# Patient Record
Sex: Female | Born: 1985 | Race: Black or African American | Hispanic: No | Marital: Single | Smoking: Never smoker
Health system: Southern US, Community
[De-identification: ages and names within clinical notes are randomized; demographics above are authoritative.]

## PROBLEM LIST (undated history)

## (undated) ENCOUNTER — Inpatient Hospital Stay: Payer: Self-pay

## (undated) DIAGNOSIS — F99 Mental disorder, not otherwise specified: Secondary | ICD-10-CM

## (undated) DIAGNOSIS — J984 Other disorders of lung: Secondary | ICD-10-CM

## (undated) DIAGNOSIS — O99019 Anemia complicating pregnancy, unspecified trimester: Secondary | ICD-10-CM

## (undated) DIAGNOSIS — F419 Anxiety disorder, unspecified: Secondary | ICD-10-CM

## (undated) DIAGNOSIS — G43909 Migraine, unspecified, not intractable, without status migrainosus: Secondary | ICD-10-CM

## (undated) DIAGNOSIS — D649 Anemia, unspecified: Secondary | ICD-10-CM

## (undated) DIAGNOSIS — D219 Benign neoplasm of connective and other soft tissue, unspecified: Secondary | ICD-10-CM

## (undated) HISTORY — DX: Other disorders of lung: J98.4

## (undated) HISTORY — PX: LAPAROSCOPY FOR ECTOPIC PREGNANCY: SUR765

## (undated) HISTORY — DX: Mental disorder, not otherwise specified: F99

## (undated) HISTORY — PX: CYST EXCISION: SHX5701

## (undated) HISTORY — DX: Migraine, unspecified, not intractable, without status migrainosus: G43.909

## (undated) HISTORY — PX: BREAST SURGERY: SHX581

## (undated) HISTORY — PX: BREAST EXCISIONAL BIOPSY: SUR124

---
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
## 2004-02-13 ENCOUNTER — Emergency Department: Payer: Self-pay | Admitting: Internal Medicine

## 2006-01-26 ENCOUNTER — Emergency Department: Payer: Self-pay | Admitting: Emergency Medicine

## 2006-07-26 ENCOUNTER — Emergency Department (HOSPITAL_COMMUNITY): Admission: EM | Admit: 2006-07-26 | Discharge: 2006-07-26 | Payer: Self-pay | Admitting: Emergency Medicine

## 2006-08-15 ENCOUNTER — Emergency Department (HOSPITAL_COMMUNITY): Admission: EM | Admit: 2006-08-15 | Discharge: 2006-08-15 | Payer: Self-pay | Admitting: *Deleted
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
## 2007-01-25 ENCOUNTER — Emergency Department: Payer: Self-pay | Admitting: Emergency Medicine

## 2007-02-08 ENCOUNTER — Emergency Department: Payer: Self-pay | Admitting: Unknown Physician Specialty

## 2007-04-20 HISTORY — PX: ECTOPIC PREGNANCY SURGERY: SHX613

## 2007-05-22 ENCOUNTER — Emergency Department: Payer: Self-pay | Admitting: Emergency Medicine
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
## 2007-11-10 ENCOUNTER — Emergency Department: Payer: Self-pay | Admitting: Emergency Medicine

## 2007-11-13 ENCOUNTER — Emergency Department: Payer: Self-pay | Admitting: Emergency Medicine

## 2007-12-05 ENCOUNTER — Emergency Department: Payer: Self-pay | Admitting: Emergency Medicine

## 2008-03-21 ENCOUNTER — Observation Stay: Payer: Self-pay | Admitting: Unknown Physician Specialty
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
## 2008-05-23 ENCOUNTER — Emergency Department: Payer: Self-pay | Admitting: Emergency Medicine
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
## 2009-05-19 ENCOUNTER — Emergency Department: Payer: Self-pay | Admitting: Unknown Physician Specialty

## 2009-06-17 ENCOUNTER — Emergency Department: Payer: Self-pay | Admitting: Internal Medicine

## 2009-06-21 ENCOUNTER — Emergency Department: Payer: Self-pay | Admitting: Emergency Medicine

## 2009-07-22 ENCOUNTER — Emergency Department: Payer: Self-pay | Admitting: Emergency Medicine
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
## 2010-04-24 ENCOUNTER — Emergency Department: Payer: Self-pay | Admitting: Unknown Physician Specialty

## 2010-08-12 ENCOUNTER — Observation Stay: Payer: Self-pay

## 2010-10-02 ENCOUNTER — Observation Stay: Payer: Self-pay

## 2010-12-12 ENCOUNTER — Observation Stay: Payer: Self-pay

## 2010-12-13 ENCOUNTER — Inpatient Hospital Stay: Payer: Self-pay

## 2011-03-29 ENCOUNTER — Emergency Department: Payer: Self-pay | Admitting: *Deleted

## 2011-09-16 ENCOUNTER — Emergency Department: Payer: Self-pay | Admitting: *Deleted
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
## 2011-10-11 ENCOUNTER — Emergency Department: Payer: Self-pay | Admitting: Emergency Medicine

## 2011-11-03 ENCOUNTER — Emergency Department: Payer: Self-pay | Admitting: *Deleted

## 2011-12-24 ENCOUNTER — Emergency Department: Payer: Self-pay | Admitting: Emergency Medicine

## 2012-03-07 ENCOUNTER — Emergency Department: Payer: Self-pay | Admitting: Emergency Medicine

## 2012-07-28 ENCOUNTER — Emergency Department: Payer: Self-pay | Admitting: Emergency Medicine

## 2012-08-26 ENCOUNTER — Emergency Department: Payer: Self-pay | Admitting: Emergency Medicine

## 2012-10-22 ENCOUNTER — Emergency Department: Payer: Self-pay | Admitting: Emergency Medicine

## 2013-04-05 LAB — HM HIV SCREENING LAB: HM HIV Screening: NEGATIVE

## 2013-08-02 ENCOUNTER — Emergency Department: Payer: Self-pay | Admitting: Emergency Medicine

## 2013-08-02 LAB — URINALYSIS, COMPLETE
Bilirubin,UR: NEGATIVE
GLUCOSE, UR: NEGATIVE mg/dL (ref 0–75)
Ketone: NEGATIVE
LEUKOCYTE ESTERASE: NEGATIVE
Nitrite: NEGATIVE
PH: 7 (ref 4.5–8.0)
PROTEIN: NEGATIVE
RBC, UR: NONE SEEN /HPF (ref 0–5)
SPECIFIC GRAVITY: 1.009 (ref 1.003–1.030)
Squamous Epithelial: 1
WBC UR: NONE SEEN /HPF (ref 0–5)

## 2013-08-02 LAB — COMPREHENSIVE METABOLIC PANEL
ALT: 21 U/L (ref 12–78)
Albumin: 4 g/dL (ref 3.4–5.0)
Alkaline Phosphatase: 61 U/L
Anion Gap: 5 — ABNORMAL LOW (ref 7–16)
BUN: 9 mg/dL (ref 7–18)
Bilirubin,Total: 0.7 mg/dL (ref 0.2–1.0)
CALCIUM: 9.1 mg/dL (ref 8.5–10.1)
CO2: 25 mmol/L (ref 21–32)
Chloride: 108 mmol/L — ABNORMAL HIGH (ref 98–107)
Creatinine: 0.86 mg/dL (ref 0.60–1.30)
GLUCOSE: 135 mg/dL — AB (ref 65–99)
Osmolality: 276 (ref 275–301)
Potassium: 3.3 mmol/L — ABNORMAL LOW (ref 3.5–5.1)
SGOT(AST): 26 U/L (ref 15–37)
SODIUM: 138 mmol/L (ref 136–145)
TOTAL PROTEIN: 8 g/dL (ref 6.4–8.2)

## 2013-08-02 LAB — CBC WITH DIFFERENTIAL/PLATELET
BASOS PCT: 0.3 %
Basophil #: 0 10*3/uL (ref 0.0–0.1)
EOS ABS: 0.1 10*3/uL (ref 0.0–0.7)
Eosinophil %: 1 %
HCT: 33.1 % — AB (ref 35.0–47.0)
HGB: 10.4 g/dL — ABNORMAL LOW (ref 12.0–16.0)
LYMPHS ABS: 1.9 10*3/uL (ref 1.0–3.6)
LYMPHS PCT: 24.6 %
MCH: 23.6 pg — ABNORMAL LOW (ref 26.0–34.0)
MCHC: 31.4 g/dL — AB (ref 32.0–36.0)
MCV: 75 fL — AB (ref 80–100)
Monocyte #: 0.3 x10 3/mm (ref 0.2–0.9)
Monocyte %: 4.3 %
Neutrophil #: 5.3 10*3/uL (ref 1.4–6.5)
Neutrophil %: 69.8 %
Platelet: 251 10*3/uL (ref 150–440)
RBC: 4.41 10*6/uL (ref 3.80–5.20)
RDW: 17.5 % — ABNORMAL HIGH (ref 11.5–14.5)
WBC: 7.6 10*3/uL (ref 3.6–11.0)

## 2013-08-02 LAB — PREGNANCY, URINE: Pregnancy Test, Urine: NEGATIVE m[IU]/mL
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
## 2013-12-25 ENCOUNTER — Emergency Department: Payer: Self-pay | Admitting: Emergency Medicine

## 2014-03-21 ENCOUNTER — Emergency Department: Payer: Self-pay | Admitting: Emergency Medicine

## 2014-03-21 LAB — URINALYSIS, COMPLETE
BACTERIA: NONE SEEN
BLOOD: NEGATIVE
Bilirubin,UR: NEGATIVE
GLUCOSE, UR: NEGATIVE mg/dL (ref 0–75)
NITRITE: NEGATIVE
PH: 5 (ref 4.5–8.0)
Protein: NEGATIVE
Specific Gravity: 1.026 (ref 1.003–1.030)
Squamous Epithelial: 4
WBC UR: 1 /HPF (ref 0–5)

## 2014-03-27 ENCOUNTER — Emergency Department: Payer: Self-pay | Admitting: Emergency Medicine

## 2014-08-24 ENCOUNTER — Emergency Department
Admission: EM | Admit: 2014-08-24 | Discharge: 2014-08-24 | Disposition: A | Discharge status: Home / Self Care | Payer: Medicaid Other | Attending: Internal Medicine | Admitting: Internal Medicine

## 2014-08-24 ENCOUNTER — Encounter: Payer: Self-pay | Admitting: Emergency Medicine

## 2014-08-24 DIAGNOSIS — Z720 Tobacco use: Secondary | ICD-10-CM | POA: Diagnosis not present

## 2014-08-24 DIAGNOSIS — L409 Psoriasis, unspecified: Secondary | ICD-10-CM | POA: Diagnosis not present

## 2014-08-24 DIAGNOSIS — R21 Rash and other nonspecific skin eruption: Secondary | ICD-10-CM | POA: Diagnosis present

## 2014-08-24 MED ORDER — PREDNISONE 20 MG PO TABS
ORAL_TABLET | ORAL | Status: AC
  Administered 2014-08-24: 60 mg via ORAL
  Filled 2014-08-24: qty 3

## 2014-08-24 MED ORDER — PREDNISONE 20 MG PO TABS
60.0000 mg | ORAL_TABLET | Freq: Once | ORAL | Status: AC
  Administered 2014-08-24: 60 mg via ORAL

## 2014-08-24 MED ORDER — RA VITAMIN A & D EX OINT
1.0000 | TOPICAL_OINTMENT | Freq: Three times a day (TID) | CUTANEOUS | Status: DC
Start: 2014-08-24 — End: 2017-10-04

## 2014-08-24 MED ORDER — PREDNISONE 10 MG (21) PO TBPK: ORAL_TABLET | ORAL | Status: DC

## 2014-08-24 NOTE — ED Provider Notes (Signed)
[facility] Emergency Department Provider Note   ____________________________________________  Time seen: 1145  I have reviewed the triage vital signs and the nursing notes.   HISTORY  Chief Complaint Rash    HPI Heidi Mata is a 29 y.o. female who presents with worsening of her chronic psoriasis. She states she awakened this morning and had new areas on her face, neck, and elbows. She's had no relief with over-the-counter creams.  History reviewed. No pertinent past medical history.  There are no active problems to display for this patient.   Past Surgical History  Procedure Laterality Date  . Cyst excision    . Ectopic pregnancy surgery  2009    Current Outpatient Rx  Name  Route  Sig  Dispense  Refill  . predniSONE (STERAPRED UNI-PAK 21 TAB) 10 MG (21) TBPK tablet      Take 6 tablets on day 1 Take 5 tablets on day 2 Take 4 tablets on day 3 Take 3 tablets on day 4 Take 2 tablets on day 5 Take 1 tablet on day 6   21 tablet   0   . Vitamins A & D (RA VITAMIN A & D) OINT   Apply externally   Apply 1 application topically 3 (three) times daily.   454 g   1     Allergies Sulfa antibiotics  History reviewed. No pertinent family history.  Social History History  Substance Use Topics  . Smoking status: Current Every Day Smoker -- 0.20 packs/day    Types: Cigarettes  . Smokeless tobacco: Not on file  . Alcohol Use: 0.6 oz/week    1 Standard drinks or equivalent per week    Review of Systems  Constitutional: Negative for fever. Eyes: Negative for visual changes. ENT: Negative for sore throat. Cardiovascular: Negative for chest pain. Respiratory: Negative for shortness of breath. Gastrointestinal: Negative for abdominal pain, vomiting and diarrhea. Musculoskeletal: Negative for pain. Skin: Positive for rash. Neurological: Negative for headaches, focal weakness or numbness.  10-point ROS otherwise  negative.  ____________________________________________   PHYSICAL EXAM:  VITAL SIGNS: ED Triage Vitals  Enc Vitals Group     BP 08/24/14 1118 131/75 mmHg     Pulse Rate 08/24/14 1118 85     Resp 08/24/14 1118 16     Temp 08/24/14 1118 98.8 F (37.1 C)     Temp Source 08/24/14 1118 Oral     SpO2 08/24/14 1118 96 %     Weight 08/24/14 1118 130 lb (58.968 kg)     Height 08/24/14 1118 5\' 2"  (1.575 m)     Head Cir --      Peak Flow --      Pain Score 08/24/14 1120 8     Pain Loc --      Pain Edu? --      Excl. in [place]? --      Constitutional: Alert and oriented. Well appearing and in no distress. Eyes: Conjunctivae are normal. PERRL. Normal extraocular movements. Denies vision change. ENT   Head: Normocephalic and atraumatic.   Nose: No congestion/rhinnorhea.   Mouth/Throat: Mucous membranes are moist.   Neck: No stridor. Respiratory: Normal respiratory effort without tachypnea nor retractions. Musculoskeletal: Nontender with normal range of motion in all extremities. No joint effusions.  No lower extremity tenderness nor edema. Neurologic:  Normal speech and language. No gross focal neurologic deficits are appreciated. Speech is normal. No gait instability. Skin:  Scaly patch posterior neck, perioral area, and inner  canthus of the right eye. Psychiatric: Mood and affect are normal. Speech and behavior are normal. Patient exhibits appropriate insight and judgment.  ____________________________________________    LABS (pertinent positives/negatives)    ____________________________________________   EKG    ____________________________________________    RADIOLOGY    ____________________________________________   PROCEDURES  Procedure(s) performed: None  Critical Care performed: No  ____________________________________________   INITIAL IMPRESSION / ASSESSMENT AND PLAN / ED COURSE  Pertinent labs & imaging results that were available  during my care of the patient were reviewed by me and considered in my medical decision making (see chart for details).  Patient strongly encouraged to follow up with a dermatologist. Advised to return to the emergency department for symptoms that change or worsen if she is unable to schedule appointment with dermatology or primary care.  ____________________________________________   FINAL CLINICAL IMPRESSION(S) / ED DIAGNOSES  Final diagnoses:  Psoriasis (a type of skin inflammation)     Victorino Dike, FNP 08/24/14 [place], DO 08/27/14 2250

## 2014-08-24 NOTE — Discharge Instructions (Signed)
Psoriasis Psoriasis is a common, long-lasting (chronic) inflammation of the skin. It affects both men and women equally, of all ages and all races. Psoriasis cannot be passed from person to person (not contagious). Psoriasis varies from mild to very severe. When severe, it can greatly affect your quality of life. Psoriasis is an inflammatory disorder affecting the skin as well as other organs including the joints (causing an arthritis). With psoriasis, the skin sheds its top layer of cells more rapidly than it does in someone without psoriasis. CAUSES  The cause of psoriasis is largely unknown. Genetics, your immune system, and the environment seem to play a role in causing psoriasis. Factors that can make psoriasis worse include:  Damage or trauma to the skin, such as cuts, scrapes, and sunburn. This damage often causes new areas of psoriasis (lesions).  Winter dryness and lack of sunlight.  Medicines such as lithium, beta-blockers, antimalarial drugs, ACE inhibitors, nonsteroidal anti-inflammatory drugs (ibuprofen, aspirin), and terbinafine. Let your caregiver know if you are taking any of these drugs.  Alcohol. Excessive alcohol use should be avoided if you have psoriasis. Drinking large amounts of alcohol can affect:  How well your psoriasis treatment works.  How safe your psoriasis treatment is.  Smoking. If you smoke, ask your caregiver for help to quit.  Stress.  Bacterial or viral infections.  Arthritis. Arthritis associated with psoriasis (psoriatic arthritis) affects less than 10% of patients with psoriasis. The arthritic intensity does not always match the skin psoriasis intensity. It is important to let your caregiver know if your joints hurt or if they are stiff. SYMPTOMS  The most common form of psoriasis begins with little red bumps that gradually become larger. The bumps begin to form scales that flake off easily. The lower layers of scales stick together. When these scales  are scratched or removed, the underlying skin is tender and bleeds easily. These areas then grow in size and may become large. Psoriasis often creates a rash that looks the same on both sides of the body (symmetrical). It often affects the elbows, knees, groin, genitals, arms, legs, scalp, and nails. Affected nails often have pitting, loosen, thicken, crumble, and are difficult to treat.  "Inverse psoriasis"occurs in the armpits, under breasts, in skin folds, and around the groin, buttocks, and genitals.  "Guttate psoriasis" generally occurs in children and young adults following a recent sore throat (strep throat). It begins with many small, red, scaly spots on the skin. It clears spontaneously in weeks or a few months without treatment. DIAGNOSIS  Psoriasis is diagnosed by physical exam. A tissue sample (biopsy) may also be taken. TREATMENT The treatment of psoriasis depends on your age, health, and living conditions.  Steroid (cortisone) creams, lotions, and ointments may be used. These treatments are associated with thinning of the skin, blood vessels that get larger (dilated), loss of skin pigmentation, and easy bruising. It is important to use these steroids as directed by your caregiver. Only treat the affected areas and not the normal, unaffected skin. People on long-term steroid treatment should wear a medical alert bracelet. Injections may be used in areas that are difficult to treat.  Scalp treatments are available as shampoos, solutions, sprays, foams, and oils. Avoid scratching the scalp and picking at the scales.  Anthralin medicine works well on areas that are difficult to treat. However, it stains clothes and skin and may cause temporary irritation.  Synthetic vitamin D (calcipotriene)can be used on small areas. It is available by prescription. The forms  of synthetic vitamin D available in health food stores do not help with psoriasis.  Coal tarsare available in various strengths  for psoriasis that is difficult to treat. They are one of the longest used treatments for difficult to treat psoriasis. However, they are messy to use.  Light therapy (UV therapy) can be carefully and professionally monitored in a dermatologist's office. Careful sunbathing is helpful for many people as directed by your caregiver. The exposure should be just long enough to cause a mild redness (erythema) of your skin. Avoid sunburn as this may make the condition worse. Sunscreen (SPF of 30 or higher) should be used to protect against sunburn. Cataracts, wrinkles, and skin aging are some of the harmful side effects of light therapy.  If creams (topical medicines) fail, there are several other options for systemic or oral medicines your caregiver can suggest. Psoriasis can sometimes be very difficult to treat. It can come and go. It is necessary to follow up with your caregiver regularly if your psoriasis is difficult to treat. Usually, with persistence you can get a good amount of relief. Maintaining consistent care is important. Do not change caregivers just because you do not see immediate results. It may take several trials to find the right combination of treatment for you. PREVENTING FLARE-UPS  Wear gloves while you wash dishes, while cleaning, and when you are outside in the cold.  If you have radiators, place a bowl of water or damp towel on the radiator. This will help put water back in the air. You can also use a humidifier to keep the air moist. Try to keep the humidity at about 60% in your home.  Apply moisturizer while your skin is still damp from bathing or showering. This traps water in the skin.  Avoid long, hot baths or showers. Keep soap use to a minimum. Soaps dry out the skin and wash away the protective oils. Use a fragrance free, dye free soap.  Drink enough water and fluids to keep your urine clear or pale yellow. Not drinking enough water depletes your skin's water  supply.  Turn off the heat at night and keep it low during the day. Cool air is less drying. SEEK MEDICAL CARE IF:  You have increasing pain in the affected areas.  You have uncontrolled bleeding in the affected areas.  You have increasing redness or warmth in the affected areas.  You start to have pain or stiffness in your joints.  You start feeling depressed about your condition.  You have a fever. Document Released: 04/02/2000 Document Revised: 06/28/2011 Document Reviewed: 09/28/2010 [facility] Patient Information 2015 [place], [place]. This information is not intended to replace advice given to you by your health care provider. Make sure you discuss any questions you have with your health care provider.  Return to the ER for symptoms that change or worsen if you are unable to schedule an appointment with the PCP.

## 2014-08-24 NOTE — ED Notes (Signed)
Patient presents to the ED with rash to neck, face, and fingers.  Patient states rash broke about about 2 days ago.  Patient states she has had similar previous rash and was told it was eczema.  Patient reports blisters are very painful.

## 2014-08-29 ENCOUNTER — Emergency Department
Admission: EM | Admit: 2014-08-29 | Discharge: 2014-08-29 | Disposition: A | Discharge status: Home / Self Care | Payer: Medicaid Other

## 2014-08-30 ENCOUNTER — Encounter: Payer: Self-pay | Admitting: Emergency Medicine

## 2014-08-30 ENCOUNTER — Emergency Department
Admission: EM | Admit: 2014-08-30 | Discharge: 2014-08-30 | Disposition: A | Discharge status: Home / Self Care | Payer: Medicaid Other | Attending: Emergency Medicine | Admitting: Emergency Medicine

## 2014-08-30 DIAGNOSIS — G43101 Migraine with aura, not intractable, with status migrainosus: Secondary | ICD-10-CM

## 2014-08-30 DIAGNOSIS — Z7952 Long term (current) use of systemic steroids: Secondary | ICD-10-CM | POA: Insufficient documentation

## 2014-08-30 DIAGNOSIS — R51 Headache: Secondary | ICD-10-CM | POA: Diagnosis present

## 2014-08-30 DIAGNOSIS — Z3202 Encounter for pregnancy test, result negative: Secondary | ICD-10-CM | POA: Diagnosis not present

## 2014-08-30 LAB — URINALYSIS COMPLETE WITH MICROSCOPIC (ARMC ONLY)
Bacteria, UA: NONE SEEN
Bilirubin Urine: NEGATIVE
GLUCOSE, UA: NEGATIVE mg/dL
Hgb urine dipstick: NEGATIVE
KETONES UR: NEGATIVE mg/dL
Leukocytes, UA: NEGATIVE
Nitrite: NEGATIVE
PH: 6 (ref 5.0–8.0)
Protein, ur: NEGATIVE mg/dL
SPECIFIC GRAVITY, URINE: 1.02 (ref 1.005–1.030)

## 2014-08-30 LAB — POCT PREGNANCY, URINE: PREG TEST UR: NEGATIVE

## 2014-08-30 MED ORDER — KETOROLAC TROMETHAMINE 30 MG/ML IJ SOLN
INTRAMUSCULAR | Status: AC
  Administered 2014-08-30: 30 mg via INTRAVENOUS
  Filled 2014-08-30: qty 1

## 2014-08-30 MED ORDER — METOCLOPRAMIDE HCL 5 MG/ML IJ SOLN
INTRAMUSCULAR | Status: AC
  Administered 2014-08-30: 10 mg via INTRAVENOUS
  Filled 2014-08-30: qty 2

## 2014-08-30 MED ORDER — METOCLOPRAMIDE HCL 5 MG/ML IJ SOLN
10.0000 mg | Freq: Once | INTRAMUSCULAR | Status: AC
  Administered 2014-08-30: 10 mg via INTRAVENOUS

## 2014-08-30 MED ORDER — SODIUM CHLORIDE 0.9 % IV BOLUS (SEPSIS)
1000.0000 mL | Freq: Once | INTRAVENOUS | Status: AC
  Administered 2014-08-30: 1000 mL via INTRAVENOUS

## 2014-08-30 MED ORDER — KETOROLAC TROMETHAMINE 30 MG/ML IJ SOLN
30.0000 mg | Freq: Once | INTRAMUSCULAR | Status: AC
  Administered 2014-08-30: 30 mg via INTRAVENOUS

## 2014-08-30 NOTE — Discharge Instructions (Signed)
Migraine Headache  Please set up an appointment to follow up with neurology as soon as possible regarding her headaches. I believe that with appropriate outpatient care, you can reduce the number of headaches that you have.  Return to the ER right away should you develop a fever, stiff neck, began vomiting again, have any confusion, weakness, drippy face, trouble speaking or any other new concerns or symptoms arise.  A migraine headache is an intense, throbbing pain on one or both sides of your head. A migraine can last for 30 minutes to several hours. CAUSES  The exact cause of a migraine headache is not always known. However, a migraine may be caused when nerves in the brain become irritated and release chemicals that cause inflammation. This causes pain. Certain things may also trigger migraines, such as:  Alcohol.  Smoking.  Stress.  Menstruation.  Aged cheeses.  Foods or drinks that contain nitrates, glutamate, aspartame, or tyramine.  Lack of sleep.  Chocolate.  Caffeine.  Hunger.  Physical exertion.  Fatigue.  Medicines used to treat chest pain (nitroglycerine), birth control pills, estrogen, and some blood pressure medicines. SIGNS AND SYMPTOMS  Pain on one or both sides of your head.  Pulsating or throbbing pain.  Severe pain that prevents daily activities.  Pain that is aggravated by any physical activity.  Nausea, vomiting, or both.  Dizziness.  Pain with exposure to bright lights, loud noises, or activity.  General sensitivity to bright lights, loud noises, or smells. Before you get a migraine, you may get warning signs that a migraine is coming (aura). An aura may include:  Seeing flashing lights.  Seeing bright spots, halos, or zigzag lines.  Having tunnel vision or blurred vision.  Having feelings of numbness or tingling.  Having trouble talking.  Having muscle weakness. DIAGNOSIS  A migraine headache is often diagnosed based  on:  Symptoms.  Physical exam.  A CT scan or MRI of your head. These imaging tests cannot diagnose migraines, but they can help rule out other causes of headaches. TREATMENT Medicines may be given for pain and nausea. Medicines can also be given to help prevent recurrent migraines.  HOME CARE INSTRUCTIONS  Only take over-the-counter or prescription medicines for pain or discomfort as directed by your health care provider. The use of long-term narcotics is not recommended.  Lie down in a dark, quiet room when you have a migraine.  Keep a journal to find out what may trigger your migraine headaches. For example, write down:  What you eat and drink.  How much sleep you get.  Any change to your diet or medicines.  Limit alcohol consumption.  Quit smoking if you smoke.  Get 7-9 hours of sleep, or as recommended by your health care provider.  Limit stress.  Keep lights dim if bright lights bother you and make your migraines worse. SEEK IMMEDIATE MEDICAL CARE IF:   Your migraine becomes severe.  You have a fever.  You have a stiff neck.  You have vision loss.  You have muscular weakness or loss of muscle control.  You start losing your balance or have trouble walking.  You feel faint or pass out.  You have severe symptoms that are different from your first symptoms. MAKE SURE YOU:   Understand these instructions.  Will watch your condition.  Will get help right away if you are not doing well or get worse. Document Released: 04/05/2005 Document Revised: 08/20/2013 Document Reviewed: 12/11/2012 [facility] Patient Information 2015 [place], [place].  This information is not intended to replace advice given to you by your health care provider. Make sure you discuss any questions you have with your health care provider. ° °

## 2014-08-30 NOTE — ED Notes (Signed)
Patient c/o vomiting since yesterday afternoon. Says it started after eating food at a work party. Also c/o sever headache.

## 2014-08-30 NOTE — ED Notes (Signed)
Pt from home. Per pt she has a history of migraine headaches. Yesterday she went to a work party and around 2pm she began experiencing a severe headache along with nausea and fatigue. She also has sensitivity to noise and light.

## 2014-08-30 NOTE — ED Notes (Signed)
poc pregnancy negative

## 2014-08-30 NOTE — ED Provider Notes (Signed)
[facility] Emergency Department Provider Note  ____________________________________________  Time seen: Approximately 11:48 AM  I have reviewed the triage vital signs and the nursing notes.   HISTORY  Chief Complaint Emesis and Headache    HPI Heidi Mata is a 29 y.o. female who presents with concerns of a "migraine" and "food poisoning".  Patient was at a work party yesterday at 2 PM, she noticed suddenly that she was seeing spots in her field of vision which she has seen previously with migraines. She states she did not have any medication to take at the time and then about 20 minutes thereafter developed a severe bifrontal pounding headache. She states she has had this same headache many times, she has been diagnosed and had CT or MRI of her brain before. She was on a regular injections for migraine prevention, but due to insurance reasons had to discontinue this.  She also reports that she started vomiting several times after the headache began which made her think she could've had food poisoning. She said her headache has been persistent. She's not had any fevers. She denies pregnancy, her last period was one week ago.  At the present time she states she has pounding headache, it is bifrontal. It is severe. It is not the worst of her life, it was rather abrupt in onset but is similar to her previous. No neck stiffness. No further vomiting today, no nausea, denies abdominal pain. No chest pain. No trouble breathing.   History reviewed. No pertinent past medical history.  There are no active problems to display for this patient.   Past Surgical History  Procedure Laterality Date  . Cyst excision    . Ectopic pregnancy surgery  2009  . Breast surgery      cyst removal, right  . Laparoscopy for ectopic pregnancy      Current Outpatient Rx  Name  Route  Sig  Dispense  Refill  . PRESCRIPTION MEDICATION      as needed (for migraines).          . predniSONE (STERAPRED UNI-PAK 21 TAB) 10 MG (21) TBPK tablet      Take 6 tablets on day 1 Take 5 tablets on day 2 Take 4 tablets on day 3 Take 3 tablets on day 4 Take 2 tablets on day 5 Take 1 tablet on day 6 Patient not taking: Reported on 08/30/2014   21 tablet   0   . Vitamins A & D (RA VITAMIN A & D) OINT   Apply externally   Apply 1 application topically 3 (three) times daily. Patient not taking: Reported on 08/30/2014   454 g   1     Allergies Lactose intolerance (gi) and Sulfa antibiotics  No family history on file.  Social History History  Substance Use Topics  . Smoking status: Never Smoker   . Smokeless tobacco: Not on file  . Alcohol Use: No    Review of Systems Constitutional: No fever/chills Eyes: No visual changes now, however did have brief episode of seeing spots or funny vision prior to headache starting. ENT: No sore throat. Cardiovascular: Denies chest pain. Respiratory: Denies shortness of breath. Gastrointestinal: No abdominal pain.  No nausea, no vomiting.  No diarrhea.  No constipation. Genitourinary: Negative for dysuria. Musculoskeletal: Negative for back pain. Skin: Negative for rash. Neurological: Negative for headaches, focal weakness or numbness.  10-point ROS otherwise negative.  ____________________________________________   PHYSICAL EXAM:  VITAL SIGNS: ED Triage Vitals  Enc Vitals Group     BP 08/30/14 1038 118/74 mmHg     Pulse Rate 08/30/14 1038 88     Resp 08/30/14 1038 16     Temp 08/30/14 1038 97.7 F (36.5 C)     Temp Source 08/30/14 1038 Oral     SpO2 08/30/14 1038 100 %     Weight 08/30/14 1038 130 lb (58.968 kg)     Height 08/30/14 1038 5\' 2"  (1.575 m)     Head Cir --      Peak Flow --      Pain Score 08/30/14 1040 8     Pain Loc --      Pain Edu? --      Excl. in [place]? --     Constitutional: Alert and oriented. Well appearing and in no acute distress. Eyes: Conjunctivae are normal. PERRL. EOMI. Head:  Atraumatic. Nose: No congestion/rhinnorhea. Mouth/Throat: Mucous membranes are moist.  Oropharynx non-erythematous. Neck: No stridor.   Cardiovascular: Normal rate, regular rhythm. Grossly normal heart sounds.  Good peripheral circulation. Respiratory: Normal respiratory effort.  No retractions. Lungs CTAB. Gastrointestinal: Soft and nontender. No distention. No abdominal bruits. No CVA tenderness. Musculoskeletal: No lower extremity tenderness nor edema.  No joint effusions. Neurologic:  Normal speech and language. No gross focal neurologic deficits are appreciated. Speech is normal. Total finger-nose-finger. Extraocular movements are intact. No visual changes. Normal fields. Cranial nerve exam is normal. Patient exhibits good strength and sensation in all extremities. The patient does have moderate photophobia. Skin:  Skin is warm, dry and intact. No rash noted. Psychiatric: Mood and affect are normal. Speech and behavior are normal.  ____________________________________________   LABS (all labs ordered are listed, but only abnormal results are displayed)  Labs Reviewed  URINALYSIS COMPLETEWITH MICROSCOPIC ([place])   POC URINE PREG, ED   ____________________________________________  EKG   ____________________________________________  RADIOLOGY     PROCEDURES  Procedure(s) performed: None  Critical Care performed: No  ____________________________________________   INITIAL IMPRESSION / ASSESSMENT AND PLAN / ED COURSE  Pertinent labs & imaging results that were available during my care of the patient were reviewed by me and considered in my medical decision making (see chart for details).  Based on the patient's description of previous migraines and the similarity of this migraine along with scintilating scotomas, this appears most consistent with recurrent migraine. There is currently no indication for imaging as the patient reports previous evaluations and care and  treatment for her migraine headaches. It is no meningismus and there is no fever.  Her abdominal exam is normal. I suspect that her vomiting is likely associated with her headache. There is no indication of acute abdomen. We will check a urine and urine pregnancy test.  We will treat her headache symptomatically as well as with hydration here in the ER. ____________________________________________ ----------------------------------------- 12:55 PM on 08/30/2014 -----------------------------------------  Patient reports she feels significant improvement. She is awake alert in no distress at this time. ____________________________________________  ----------------------------------------- 1:25 PM on 08/30/2014 -----------------------------------------  urine pregnancy test unremarkable.  Reevaluation this time the patient states all of her symptoms are resolved. Her headache is gone. This appears most consistent with acute migraine.  I discussed with the patient return precautions and she now has Medicaid so she will follow up neurology as an outpatient.    FINAL CLINICAL IMPRESSION(S) / ED DIAGNOSES  Final diagnoses:  None      Delman Kitten, MD 08/30/14 1326

## 2014-12-10 ENCOUNTER — Emergency Department (HOSPITAL_COMMUNITY)
Admission: EM | Admit: 2014-12-10 | Discharge: 2014-12-11 | Disposition: A | Discharge status: Home / Self Care | Payer: Medicaid Other | Attending: Emergency Medicine | Admitting: Emergency Medicine

## 2014-12-10 ENCOUNTER — Encounter (HOSPITAL_COMMUNITY): Payer: Self-pay

## 2014-12-10 DIAGNOSIS — G43809 Other migraine, not intractable, without status migrainosus: Secondary | ICD-10-CM

## 2014-12-10 DIAGNOSIS — R51 Headache: Secondary | ICD-10-CM | POA: Diagnosis present

## 2014-12-10 DIAGNOSIS — Z8639 Personal history of other endocrine, nutritional and metabolic disease: Secondary | ICD-10-CM | POA: Insufficient documentation

## 2014-12-10 DIAGNOSIS — H109 Unspecified conjunctivitis: Secondary | ICD-10-CM | POA: Insufficient documentation

## 2014-12-10 HISTORY — DX: Other disorders of iron metabolism: E83.19

## 2014-12-10 HISTORY — DX: Migraine, unspecified, not intractable, without status migrainosus: G43.909

## 2014-12-10 MED ORDER — ONDANSETRON 4 MG PO TBDP
4.0000 mg | ORAL_TABLET | Freq: Once | ORAL | Status: AC | PRN
  Administered 2014-12-10: 4 mg via ORAL

## 2014-12-10 MED ORDER — POLYMYXIN B-TRIMETHOPRIM 10000-0.1 UNIT/ML-% OP SOLN: 1.0000 [drp] | OPHTHALMIC | Status: DC

## 2014-12-10 MED ORDER — ONDANSETRON 4 MG PO TBDP
ORAL_TABLET | ORAL | Status: AC
  Filled 2014-12-10: qty 1

## 2014-12-10 MED ORDER — KETOROLAC TROMETHAMINE 60 MG/2ML IM SOLN
60.0000 mg | Freq: Once | INTRAMUSCULAR | Status: AC
  Administered 2014-12-10: 60 mg via INTRAMUSCULAR
  Filled 2014-12-10: qty 2

## 2014-12-10 MED ORDER — DIPHENHYDRAMINE HCL 50 MG/ML IJ SOLN
25.0000 mg | Freq: Once | INTRAMUSCULAR | Status: AC
  Administered 2014-12-10: 25 mg via INTRAMUSCULAR
  Filled 2014-12-10: qty 1

## 2014-12-10 MED ORDER — METOCLOPRAMIDE HCL 5 MG/ML IJ SOLN
10.0000 mg | Freq: Once | INTRAMUSCULAR | Status: AC
  Administered 2014-12-10: 10 mg via INTRAMUSCULAR
  Filled 2014-12-10: qty 2

## 2014-12-10 MED ORDER — TIZANIDINE HCL 4 MG PO CAPS: 4.0000 mg | ORAL_CAPSULE | Freq: Three times a day (TID) | ORAL | Status: DC | PRN

## 2014-12-10 NOTE — ED Notes (Signed)
PA at the bedside.

## 2014-12-10 NOTE — ED Provider Notes (Signed)
CSN: [id number]     Arrival date & time 12/10/14  1841 History   First MD Initiated Contact with Patient 12/10/14 2134     Chief Complaint  Patient presents with  . Emesis  . Headache     (Consider location/radiation/quality/duration/timing/severity/associated sxs/prior Treatment) HPI Comments: Patient is a 29 year old female past medical history significant for migraines presenting to the emergency department for evaluation of a headache that began this morning. Patient states her headache has been gradually worsening. Describes severe throbbing pulsing pain behind her eyes with associated nausea and 3 episodes of nonbloody nonbilious emesis. Patient states she's not tried any medications at home for her headaches, does endorse this feels like her migraines and she typically gets. Patient is also complaining about some left eye swelling with yellow purulent drainage and eye irritation. She does not wear contact lenses or glasses. She's not had any fevers. No modifying factors headache or eye irritation noted. No sick contacts.  Patient is a 29 y.o. female presenting with vomiting and headaches. The history is provided by the patient.  Emesis Associated symptoms: headaches   Headaches:    Severity:  Severe   Onset quality:  Gradual   Timing:  Constant   Progression:  Worsening   Chronicity:  Recurrent Headache Pain location:  Frontal Quality: throbbing pulsing. Radiates to:  Eyes Associated symptoms: vomiting   Associated symptoms: no fever     Past Medical History  Diagnosis Date  . Migraines   . Hemosiderosis    Past Surgical History  Procedure Laterality Date  . Cyst excision    . Ectopic pregnancy surgery  2009  . Breast surgery      cyst removal, right  . Laparoscopy for ectopic pregnancy     History reviewed. No pertinent family history. Social History  Substance Use Topics  . Smoking status: Never Smoker   . Smokeless tobacco: None  . Alcohol Use: No   OB  History    No data available     Review of Systems  Constitutional: Negative for fever.  Eyes: Positive for discharge, redness and itching.  Gastrointestinal: Positive for vomiting.  Neurological: Positive for headaches.  All other systems reviewed and are negative.     Allergies  Lactose intolerance (gi) and Sulfa antibiotics  Home Medications   Prior to Admission medications   Medication Sig Start Date End Date Taking? Authorizing Provider  predniSONE (STERAPRED UNI-PAK 21 TAB) 10 MG (21) TBPK tablet Take 6 tablets on day 1 Take 5 tablets on day 2 Take 4 tablets on day 3 Take 3 tablets on day 4 Take 2 tablets on day 5 Take 1 tablet on day 6 Patient not taking: Reported on 08/30/2014 08/24/14   Victorino Dike, FNP  PRESCRIPTION MEDICATION as needed (for migraines).    Historical Provider, MD  tiZANidine (ZANAFLEX) 4 MG capsule Take 1 capsule (4 mg total) by mouth 3 (three) times daily as needed (headaches). 12/10/14   Teancum Brule, PA-C  trimethoprim-polymyxin b (POLYTRIM) ophthalmic solution Place 1 drop into the left eye every 4 (four) hours. X 5 days 12/10/14   Baron Sane, PA-C  Vitamins A & D (RA VITAMIN A & D) OINT Apply 1 application topically 3 (three) times daily. Patient not taking: Reported on 08/30/2014 08/24/14   Victorino Dike, FNP   BP 117/76 mmHg  Pulse 67  Temp(Src) 98 F (36.7 C) (Oral)  Resp 16  Ht 5\' 2"  (1.575 m)  Wt 135 lb 8 oz (61.462  kg)  BMI 24.78 kg/m2  SpO2 100%  LMP 12/06/2014 Physical Exam  Constitutional: She is oriented to person, place, and time. She appears well-developed and well-nourished. No distress.  HENT:  Head: Normocephalic and atraumatic.  Right Ear: External ear normal.  Left Ear: External ear normal.  Nose: Nose normal.  Mouth/Throat: Oropharynx is clear and moist. No oropharyngeal exudate.  Eyes: EOM are normal. Pupils are equal, round, and reactive to light.  Slight left conjunctival injection. Mild upper  left eyelid swelling without erythema or warmth. Dried yellow purulent drainage noted. Nontender to palpation. No gross periorbital or orbital swelling or edema or tenderness noted.  Neck: Normal range of motion. Neck supple.  Cardiovascular: Normal rate, regular rhythm, normal heart sounds and intact distal pulses.   Pulmonary/Chest: Effort normal and breath sounds normal. No respiratory distress.  Abdominal: Soft. There is no tenderness.  Neurological: She is alert and oriented to person, place, and time. She has normal strength. No cranial nerve deficit. Gait normal. GCS eye subscore is 4. GCS verbal subscore is 5. GCS motor subscore is 6.  Sensation grossly intact.  No pronator drift.  Bilateral heel-knee-shin intact.  Skin: Skin is warm and dry. She is not diaphoretic.  Nursing note and vitals reviewed.   ED Course  Procedures (including critical care time) Medications  ondansetron (ZOFRAN-ODT) disintegrating tablet 4 mg (4 mg Oral Given 12/10/14 1930)  diphenhydrAMINE (BENADRYL) injection 25 mg (25 mg Intramuscular Given 12/10/14 2228)  metoCLOPramide (REGLAN) injection 10 mg (10 mg Intramuscular Given 12/10/14 2228)  ketorolac (TORADOL) injection 60 mg (60 mg Intramuscular Given 12/10/14 2337)    Labs Review Labs Reviewed - No data to display  Imaging Review No results found. I have personally reviewed and evaluated these images and lab results as part of my medical decision-making.   EKG Interpretation None      Patient refusing further eye testing.   MDM   Final diagnoses:  Other migraine without status migrainosus, not intractable  Conjunctivitis of left eye    Filed Vitals:   12/10/14 2345  BP: 117/76  Pulse: 67  Temp: 98 F (36.7 C)  Resp: 16   Afebrile, NAD, non-toxic appearing, AAOx4.   1) HA: Pt HA treated and improved while in ED.  Presentation is like pts typical HA and non concerning for [facility], ICH, Meningitis, or temporal arteritis. Pt is afebrile  with no focal neuro deficits, nuchal rigidity, or change in vision. Pt is to follow up with PCP to discuss prophylactic medication. Pt verbalizes understanding and is agreeable with plan to dc.    2) Conjunctivitis: Patient presentation consistent with bacterial conjunctivitis. Conjunctival injection with purulent discharge. No entrapment, consensual photophobia.  Presentation non-concerning for iritis, corneal abrasions, or HSV.  Will prescribe Polytrim drops. Patient is not a contact lens wearer.  Personal hygiene and frequent handwashing discussed.  Patient advised to followup with PCP if symptoms persist or worsen in any way including vision change or purulent discharge.  Patient/parent/guardian verbalizes understanding and is agreeable with discharge.     Baron Sane, PA-C 12/11/14 1700  Serita Grit, MD 12/11/14 [id number]

## 2014-12-10 NOTE — ED Notes (Signed)
Onset yesterday vomiting x 3-4 today, vomiting in triage, headache, left swollen eye with yellow drainage.  Has h/o migraines

## 2014-12-10 NOTE — Discharge Instructions (Signed)
Please follow up with your primary care physician in 1-2 days. If you do not have one please call the [place] number listed above. Please read all discharge instructions and return precautions.   Migraine Headache A migraine headache is an intense, throbbing pain on one or both sides of your head. A migraine can last for 30 minutes to several hours. CAUSES  The exact cause of a migraine headache is not always known. However, a migraine may be caused when nerves in the brain become irritated and release chemicals that cause inflammation. This causes pain. Certain things may also trigger migraines, such as:  Alcohol.  Smoking.  Stress.  Menstruation.  Aged cheeses.  Foods or drinks that contain nitrates, glutamate, aspartame, or tyramine.  Lack of sleep.  Chocolate.  Caffeine.  Hunger.  Physical exertion.  Fatigue.  Medicines used to treat chest pain (nitroglycerine), birth control pills, estrogen, and some blood pressure medicines. SIGNS AND SYMPTOMS  Pain on one or both sides of your head.  Pulsating or throbbing pain.  Severe pain that prevents daily activities.  Pain that is aggravated by any physical activity.  Nausea, vomiting, or both.  Dizziness.  Pain with exposure to bright lights, loud noises, or activity.  General sensitivity to bright lights, loud noises, or smells. Before you get a migraine, you may get warning signs that a migraine is coming (aura). An aura may include:  Seeing flashing lights.  Seeing bright spots, halos, or zigzag lines.  Having tunnel vision or blurred vision.  Having feelings of numbness or tingling.  Having trouble talking.  Having muscle weakness. DIAGNOSIS  A migraine headache is often diagnosed based on:  Symptoms.  Physical exam.  A CT scan or MRI of your head. These imaging tests cannot diagnose migraines, but they can help rule out other causes of headaches. TREATMENT Medicines may  be given for pain and nausea. Medicines can also be given to help prevent recurrent migraines.  HOME CARE INSTRUCTIONS  Only take over-the-counter or prescription medicines for pain or discomfort as directed by your health care provider. The use of long-term narcotics is not recommended.  Lie down in a dark, quiet room when you have a migraine.  Keep a journal to find out what may trigger your migraine headaches. For example, write down:  What you eat and drink.  How much sleep you get.  Any change to your diet or medicines.  Limit alcohol consumption.  Quit smoking if you smoke.  Get 7-9 hours of sleep, or as recommended by your health care provider.  Limit stress.  Keep lights dim if bright lights bother you and make your migraines worse. SEEK IMMEDIATE MEDICAL CARE IF:   Your migraine becomes severe.  You have a fever.  You have a stiff neck.  You have vision loss.  You have muscular weakness or loss of muscle control.  You start losing your balance or have trouble walking.  You feel faint or pass out.  You have severe symptoms that are different from your first symptoms. MAKE SURE YOU:   Understand these instructions.  Will watch your condition.  Will get help right away if you are not doing well or get worse. Document Released: 04/05/2005 Document Revised: 08/20/2013 Document Reviewed: 12/11/2012 [facility] Patient Information 2015 [place], [place]. This information is not intended to replace advice given to you by your health care provider. Make sure you discuss any questions you have with your health care provider.  Bacterial  Conjunctivitis Bacterial conjunctivitis (commonly called pink eye) is redness, soreness, or puffiness (inflammation) of the white part of your eye. It is caused by a germ called bacteria. These germs can easily spread from person to person (contagious). Your eye often will become red or pink. Your eye may also become irritated, watery, or  have a thick discharge.  HOME CARE   Apply a cool, clean washcloth over closed eyelids. Do this for 10-20 minutes, 3-4 times a day while you have pain.  Gently wipe away any fluid coming from the eye with a warm, wet washcloth or cotton ball.  Wash your hands often with soap and water. Use paper towels to dry your hands.  Do not share towels or washcloths.  Change or wash your pillowcase every day.  Do not use eye makeup until the infection is gone.  Do not use machines or drive if your vision is blurry.  Stop using contact lenses. Do not use them again until your doctor says it is okay.  Do not touch the tip of the eye drop bottle or medicine tube with your fingers when you put medicine on the eye. GET HELP RIGHT AWAY IF:   Your eye is not better after 3 days of starting your medicine.  You have a yellowish fluid coming out of the eye.  You have more pain in the eye.  Your eye redness is spreading.  Your vision becomes blurry.  You have a fever or lasting symptoms for more than 2-3 days.  You have a fever and your symptoms suddenly get worse.  You have pain in the face.  Your face gets red or puffy (swollen). MAKE SURE YOU:   Understand these instructions.  Will watch this condition.  Will get help right away if you are not doing well or get worse. Document Released: 01/13/2008 Document Revised: 03/22/2012 Document Reviewed: 12/10/2011 [facility] Patient Information 2015 [place], [place]. This information is not intended to replace advice given to you by your health care provider. Make sure you discuss any questions you have with your health care provider.

## 2014-12-10 NOTE — ED Notes (Signed)
Patient refused to complete Visual acuity. Patient states " I cant ready this small print with a migraine" patient states " just tell them I can see".

## 2014-12-10 NOTE — ED Notes (Signed)
Pt has not taken any pain meds.

## 2015-01-31 ENCOUNTER — Emergency Department: Payer: Medicaid Other

## 2015-01-31 ENCOUNTER — Emergency Department
Admission: EM | Admit: 2015-01-31 | Discharge: 2015-01-31 | Disposition: A | Discharge status: Home / Self Care | Payer: Medicaid Other | Attending: Emergency Medicine | Admitting: Emergency Medicine

## 2015-01-31 DIAGNOSIS — Z79899 Other long term (current) drug therapy: Secondary | ICD-10-CM | POA: Insufficient documentation

## 2015-01-31 DIAGNOSIS — X58XXXA Exposure to other specified factors, initial encounter: Secondary | ICD-10-CM | POA: Diagnosis not present

## 2015-01-31 DIAGNOSIS — Y998 Other external cause status: Secondary | ICD-10-CM | POA: Diagnosis not present

## 2015-01-31 DIAGNOSIS — Y9389 Activity, other specified: Secondary | ICD-10-CM | POA: Diagnosis not present

## 2015-01-31 DIAGNOSIS — S93601A Unspecified sprain of right foot, initial encounter: Secondary | ICD-10-CM

## 2015-01-31 DIAGNOSIS — Y9289 Other specified places as the place of occurrence of the external cause: Secondary | ICD-10-CM | POA: Insufficient documentation

## 2015-01-31 DIAGNOSIS — S99911A Unspecified injury of right ankle, initial encounter: Secondary | ICD-10-CM | POA: Diagnosis present

## 2015-01-31 MED ORDER — NAPROXEN 500 MG PO TABS: 500.0000 mg | ORAL_TABLET | Freq: Two times a day (BID) | ORAL | Status: DC

## 2015-01-31 MED ORDER — HYDROCODONE-ACETAMINOPHEN 5-325 MG PO TABS: 1.0000 | ORAL_TABLET | ORAL | Status: DC | PRN

## 2015-01-31 NOTE — ED Provider Notes (Signed)
[facility] Emergency Department Provider Note  ____________________________________________  Time seen: Approximately 8:46 AM  I have reviewed the triage vital signs and the nursing notes.   HISTORY  Chief Complaint Ankle Pain  HPI Heidi Mata is a 29 y.o. female is here with complaint of right ankle pain since Saturday which was 6 days ago. Patient states that she twisted her ankle and since then has continued to have swelling and pain. She denies any previous injury to her ankle. She is been taking over-the-counter ibuprofen every 2 hours for the pain.Initially she had GI upset and her mother told her to discontinue taking the ibuprofen. Currently she rates her pain is 7 out of 10.   Past Medical History  Diagnosis Date  . Migraines   . Hemosiderosis     There are no active problems to display for this patient.   Past Surgical History  Procedure Laterality Date  . Cyst excision    . Ectopic pregnancy surgery  2009  . Breast surgery      cyst removal, right  . Laparoscopy for ectopic pregnancy      Current Outpatient Rx  Name  Route  Sig  Dispense  Refill  . HYDROcodone-acetaminophen (NORCO/VICODIN) 5-325 MG tablet   Oral   Take 1 tablet by mouth every 4 (four) hours as needed for moderate pain.   20 tablet   0   . naproxen (NAPROSYN) 500 MG tablet   Oral   Take 1 tablet (500 mg total) by mouth 2 (two) times daily with a meal.   30 tablet   0   . predniSONE (STERAPRED UNI-PAK 21 TAB) 10 MG (21) TBPK tablet      Take 6 tablets on day 1 Take 5 tablets on day 2 Take 4 tablets on day 3 Take 3 tablets on day 4 Take 2 tablets on day 5 Take 1 tablet on day 6 Patient not taking: Reported on 08/30/2014   21 tablet   0   . PRESCRIPTION MEDICATION      as needed (for migraines).         Marland Kitchen tiZANidine (ZANAFLEX) 4 MG capsule   Oral   Take 1 capsule (4 mg total) by mouth 3 (three) times daily as needed (headaches).   20 capsule  0   . trimethoprim-polymyxin b (POLYTRIM) ophthalmic solution   Left Eye   Place 1 drop into the left eye every 4 (four) hours. X 5 days   10 mL   0   . Vitamins A & D (RA VITAMIN A & D) OINT   Apply externally   Apply 1 application topically 3 (three) times daily. Patient not taking: Reported on 08/30/2014   454 g   1     Allergies Lactose intolerance (gi) and Sulfa antibiotics  No family history on file.  Social History Social History  Substance Use Topics  . Smoking status: Never Smoker   . Smokeless tobacco: None  . Alcohol Use: No    Review of Systems Constitutional: No fever/chills Cardiovascular: Denies chest pain. Respiratory: Denies shortness of breath. Gastrointestinal:  No nausea, no vomiting.  Musculoskeletal: Negative for back pain. Positive right ankle pain Skin: Negative for rash. Neurological: Negative for headaches, focal weakness or numbness.  10-point ROS otherwise negative.  ____________________________________________   PHYSICAL EXAM:  VITAL SIGNS: ED Triage Vitals  Enc Vitals Group     BP 01/31/15 0834 118/68 mmHg     Pulse Rate 01/31/15 0834  86     Resp 01/31/15 0834 16     Temp 01/31/15 0834 98.4 F (36.9 C)     Temp Source 01/31/15 0834 Oral     SpO2 01/31/15 0834 100 %     Weight 01/31/15 0834 130 lb (58.968 kg)     Height 01/31/15 0834 5\' 2"  (1.575 m)     Head Cir --      Peak Flow --      Pain Score 01/31/15 0834 7     Pain Loc --      Pain Edu? --      Excl. in [place]? --     Constitutional: Alert and oriented. Well appearing and in no acute distress. Eyes: Conjunctivae are normal. PERRL. EOMI. Head: Atraumatic. Nose: No congestion/rhinnorhea. Neck: No stridor.   Cardiovascular: Normal rate, regular rhythm. Grossly normal heart sounds.  Good peripheral circulation. Respiratory: Normal respiratory effort.  No retractions. Lungs CTAB. Gastrointestinal: Soft and nontender. No distention. Musculoskeletal: There is moderate  tenderness on palpation of the right foot dorsal aspect. There is no gross deformity. There is minimal edema present. Ankle mortise nontender to palpation and range of motion is within normal limits. Motor sensory function intact to the digits. Neurologic:  Normal speech and language. No gross focal neurologic deficits are appreciated. No gait instability. Skin:  Skin is warm, dry and intact. No rash noted. Skin is intact and there is no ecchymosis or abrasions noted. Psychiatric: Mood and affect are normal. Speech and behavior are normal.  ____________________________________________   LABS (all labs ordered are listed, but only abnormal results are displayed)  Labs Reviewed - No data to display  RADIOLOGY  Examination of right foot fails to show any acute fracture. ____________________________________________   PROCEDURES  Procedure(s) performed: None  Critical Care performed: No  ____________________________________________   INITIAL IMPRESSION / ASSESSMENT AND PLAN / ED COURSE  Pertinent labs & imaging results that were available during my care of the patient were reviewed by me and considered in my medical decision making (see chart for details).  Patient was discharged with an Ace wrap and postop shoe. Patient was prescribed Naprosyn penicillin 500 mg twice a day with food and Norco as needed for pain. She is to follow-up with her PCP or Dr. Elvina Mattes if any continued problems with her foot. ____________________________________________   FINAL CLINICAL IMPRESSION(S) / ED DIAGNOSES  Final diagnoses:  Sprain of right foot, initial encounter      Johnn Hai, PA-C 01/31/15 1347  Delman Kitten, MD 01/31/15 1504

## 2015-01-31 NOTE — Discharge Instructions (Signed)
Foot Sprain °A foot sprain is an injury to one of the strong bands of tissue (ligaments) that connect and support the many bones in your feet. The ligament can be stretched too much or it can tear. A tear can be either partial or complete. The severity of the sprain depends on how much of the ligament was damaged or torn. °CAUSES °A foot sprain is usually caused by suddenly twisting or pivoting your foot. °RISK FACTORS °This injury is more likely to occur in people who: °· Play a sport, such as basketball or football. °· Exercise or play a sport without warming up. °· Start a new workout or sport. °· Suddenly increase how long or hard they exercise or play a sport. °SYMPTOMS °Symptoms of this condition start soon after an injury and include: °· Pain, especially in the arch of the foot. °· Bruising. °· Swelling. °· Inability to walk or use the foot to support body weight. °DIAGNOSIS °This condition is diagnosed with a medical history and physical exam. You may also have imaging tests, such as: °· X-rays to make sure there are no broken bones (fractures). °· MRI to see if the ligament has torn. °TREATMENT °Treatment varies depending on the severity of your sprain. Mild sprains can be treated with rest, ice, compression, and elevation (RICE). If your ligament is overstretched or partially torn, treatment usually involves keeping your foot in a fixed position (immobilization) for a period of time. To help you do this, your health care provider will apply a bandage, splint, or walking boot to keep your foot from moving until it heals. You may also be advised to use crutches or a scooter for a few weeks to avoid bearing weight on your foot while it is healing. °If your ligament is fully torn, you may need surgery to reconnect the ligament to the bone. After surgery, a cast or splint will be applied and will need to stay on your foot while it heals. °Your health care provider may also suggest exercises or physical therapy  to strengthen your foot. °HOME CARE INSTRUCTIONS °If You Have a Bandage, Splint, or Walking Boot: °· Wear it as directed by your health care provider. Remove it only as directed by your health care provider. °· Loosen the bandage, splint, or walking boot if your toes become numb and tingle, or if they turn cold and blue. °Bathing °· If your health care provider approves bathing and showering, cover the bandage or splint with a watertight plastic bag to protect it from water. Do not let the bandage or splint get wet. °Managing Pain, Stiffness, and Swelling  °· If directed, apply ice to the injured area: °¨ Put ice in a plastic bag. °¨ Place a towel between your skin and the bag. °¨ Leave the ice on for 20 minutes, 2-3 times per day. °· Move your toes often to avoid stiffness and to lessen swelling. °· Raise (elevate) the injured area above the level of your heart while you are sitting or lying down. °Driving °· Do not drive or operate heavy machinery while taking pain medicine. °· Do not drive while wearing a bandage, splint, or walking boot on a foot that you use for driving. °Activity °· Rest as directed by your health care provider. °· Do not use the injured foot to support your body weight until your health care provider says that you can. Use crutches or other supportive devices as directed by your health care provider. °· Ask your health care   provider what activities are safe for you. Gradually increase how much and how far you walk until your health care provider says it is safe to return to full activity.  Do any exercise or physical therapy as directed by your health care provider. General Instructions  If a splint was applied, do not put pressure on any part of it until it is fully hardened. This may take several hours.  Take medicines only as directed by your health care provider. These include over-the-counter medicines and prescription medicines.  Keep all follow-up visits as directed by your  health care provider. This is important.  When you can walk without pain, wear supportive shoes that have stiff soles. Do not wear flip-flops, and do not walk barefoot. SEEK MEDICAL CARE IF:  Your pain is not controlled with medicine.  Your bruising or swelling gets worse or does not get better with treatment.  Your splint or walking boot is damaged. SEEK IMMEDIATE MEDICAL CARE IF:  Your foot is numb or blue.  Your foot feels colder than normal.   This information is not intended to replace advice given to you by your health care provider. Make sure you discuss any questions you have with your health care provider.   Document Released: 09/25/2001 Document Revised: 08/20/2014 Document Reviewed: 02/06/2014 Elsevier Interactive Patient Education 2016 [employer]  Cryotherapy Cryotherapy is when you put ice on your injury. Ice helps lessen pain and puffiness (swelling) after an injury. Ice works the best when you start using it in the first 24 to 48 hours after an injury. HOME CARE  Put a dry or damp towel between the ice pack and your skin.  You may press gently on the ice pack.  Leave the ice on for no more than 10 to 20 minutes at a time.  Check your skin after 5 minutes to make sure your skin is okay.  Rest at least 20 minutes between ice pack uses.  Stop using ice when your skin loses feeling (numbness).  Do not use ice on someone who cannot tell you when it hurts. This includes small children and people with memory problems (dementia). GET HELP RIGHT AWAY IF:  You have white spots on your skin.  Your skin turns blue or pale.  Your skin feels waxy or hard.  Your puffiness gets worse. MAKE SURE YOU:   Understand these instructions.  Will watch your condition.  Will get help right away if you are not doing well or get worse.   This information is not intended to replace advice given to you by your health care provider. Make sure you discuss any questions you  have with your health care provider.   Document Released: 09/22/2007 Document Revised: 06/28/2011 Document Reviewed: 11/26/2010 Elsevier Interactive Patient Education [employer].

## 2015-01-31 NOTE — ED Notes (Signed)
Pt states she twisted her right ankle on Saturday and is having pain and swelling since

## 2015-07-22 ENCOUNTER — Encounter: Payer: Self-pay | Admitting: Emergency Medicine

## 2015-07-22 ENCOUNTER — Emergency Department
Admission: EM | Admit: 2015-07-22 | Discharge: 2015-07-22 | Disposition: A | Discharge status: Home / Self Care | Payer: Medicaid Other | Attending: Emergency Medicine | Admitting: Emergency Medicine

## 2015-07-22 DIAGNOSIS — B029 Zoster without complications: Secondary | ICD-10-CM | POA: Insufficient documentation

## 2015-07-22 DIAGNOSIS — Y929 Unspecified place or not applicable: Secondary | ICD-10-CM | POA: Diagnosis not present

## 2015-07-22 DIAGNOSIS — Y999 Unspecified external cause status: Secondary | ICD-10-CM | POA: Insufficient documentation

## 2015-07-22 DIAGNOSIS — Y939 Activity, unspecified: Secondary | ICD-10-CM | POA: Insufficient documentation

## 2015-07-22 DIAGNOSIS — S30860A Insect bite (nonvenomous) of lower back and pelvis, initial encounter: Secondary | ICD-10-CM | POA: Diagnosis present

## 2015-07-22 DIAGNOSIS — W57XXXA Bitten or stung by nonvenomous insect and other nonvenomous arthropods, initial encounter: Secondary | ICD-10-CM | POA: Diagnosis not present

## 2015-07-22 LAB — CBC WITH DIFFERENTIAL/PLATELET
BASOS ABS: 0.1 10*3/uL (ref 0–0.1)
Basophils Relative: 2 %
Eosinophils Absolute: 0.1 10*3/uL (ref 0–0.7)
Eosinophils Relative: 2 %
HEMATOCRIT: 35.7 % (ref 35.0–47.0)
HEMOGLOBIN: 11.7 g/dL — AB (ref 12.0–16.0)
LYMPHS PCT: 24 %
Lymphs Abs: 1.8 10*3/uL (ref 1.0–3.6)
MCH: 26.5 pg (ref 26.0–34.0)
MCHC: 32.7 g/dL (ref 32.0–36.0)
MCV: 81 fL (ref 80.0–100.0)
MONO ABS: 0.8 10*3/uL (ref 0.2–0.9)
MONOS PCT: 10 %
NEUTROS ABS: 4.9 10*3/uL (ref 1.4–6.5)
Neutrophils Relative %: 62 %
Platelets: 200 10*3/uL (ref 150–440)
RBC: 4.41 MIL/uL (ref 3.80–5.20)
RDW: 14.5 % (ref 11.5–14.5)
WBC: 7.8 10*3/uL (ref 3.6–11.0)

## 2015-07-22 LAB — BASIC METABOLIC PANEL
ANION GAP: 3 — AB (ref 5–15)
BUN: 12 mg/dL (ref 6–20)
CALCIUM: 9.2 mg/dL (ref 8.9–10.3)
CHLORIDE: 105 mmol/L (ref 101–111)
CO2: 26 mmol/L (ref 22–32)
Creatinine, Ser: 0.95 mg/dL (ref 0.44–1.00)
GFR calc Af Amer: >60 mL/min (ref >60)
GFR calc non Af Amer: >60 mL/min (ref >60)
GLUCOSE: 93 mg/dL (ref 65–99)
Potassium: 3.5 mmol/L (ref 3.5–5.1)
Sodium: 134 mmol/L — ABNORMAL LOW (ref 135–145)

## 2015-07-22 MED ORDER — ACYCLOVIR 800 MG PO TABS: 800.0000 mg | ORAL_TABLET | Freq: Every day | ORAL | Status: DC

## 2015-07-22 MED ORDER — ONDANSETRON 4 MG PO TBDP
4.0000 mg | ORAL_TABLET | Freq: Once | ORAL | Status: AC
  Administered 2015-07-22: 4 mg via ORAL

## 2015-07-22 MED ORDER — PREDNISONE 20 MG PO TABS
60.0000 mg | ORAL_TABLET | Freq: Once | ORAL | Status: AC
  Administered 2015-07-22: 60 mg via ORAL
  Filled 2015-07-22: qty 3

## 2015-07-22 MED ORDER — ONDANSETRON 4 MG PO TBDP
ORAL_TABLET | ORAL | Status: AC
  Filled 2015-07-22: qty 1

## 2015-07-22 MED ORDER — PREDNISONE 10 MG (21) PO TBPK: ORAL_TABLET | ORAL | Status: DC

## 2015-07-22 MED ORDER — OXYCODONE-ACETAMINOPHEN 5-325 MG PO TABS
2.0000 | ORAL_TABLET | Freq: Once | ORAL | Status: AC
  Administered 2015-07-22: 2 via ORAL
  Filled 2015-07-22: qty 2

## 2015-07-22 MED ORDER — OXYCODONE-ACETAMINOPHEN 5-325 MG PO TABS: 2.0000 | ORAL_TABLET | Freq: Four times a day (QID) | ORAL | Status: DC | PRN

## 2015-07-22 NOTE — Discharge Instructions (Signed)

## 2015-07-22 NOTE — ED Notes (Addendum)
Pt presents to ED with cluster of blisters to her "left butt cheek". Pt states she woke up around 1400 yesterday nd noticed a quarter sized painful area with blisters, pain, swelling; unsure if bitten by an insect. Throughout the day pt has noticed swelling to her left hip and into her groin. Swelling noted. Vomiting 2X yesterday evening. Pt reports feeling fatigued and "woozy".

## 2015-07-22 NOTE — ED Provider Notes (Signed)
[facility] Emergency Department Provider Note     Time seen: ----------------------------------------- 7:26 AM on 07/22/2015 -----------------------------------------    I have reviewed the triage vital signs and the nursing notes.   HISTORY  Chief Complaint Insect Bite and Groin Swelling    HPI Heidi Mata is a 30 y.o. female who presents to ER with painful blisters in her left buttocks. Patient states she woke up around 2 PM yesterday and noticed a quarter-sized area that had blisters on her left buttocks. It was painful and swollen, pain seems to radiate into her groin area. She reports feeling "woozy" but otherwise denies complaints or recent illness. Patient reports increased stress.   Past Medical History  Diagnosis Date  . Migraines   . Hemosiderosis     There are no active problems to display for this patient.   Past Surgical History  Procedure Laterality Date  . Cyst excision    . Ectopic pregnancy surgery  2009  . Breast surgery      cyst removal, right  . Laparoscopy for ectopic pregnancy      Allergies Lactose intolerance (gi) and Sulfa antibiotics  Social History Social History  Substance Use Topics  . Smoking status: Never Smoker   . Smokeless tobacco: None  . Alcohol Use: No    Review of Systems Constitutional: Negative for fever. Gastrointestinal: Negative for abdominal pain, positive for vomiting Musculoskeletal: Positive for left buttock pain Skin: Positive for painful rash on her left buttocks Neurological: Negative for headaches, focal weakness or numbness.   ____________________________________________   PHYSICAL EXAM:  VITAL SIGNS: ED Triage Vitals  Enc Vitals Group     BP 07/22/15 0215 98/60 mmHg     Pulse Rate 07/22/15 0215 85     Resp 07/22/15 0646 16     Temp 07/22/15 0215 98.4 F (36.9 C)     Temp Source 07/22/15 0215 Oral     SpO2 07/22/15 0215 100 %     Weight 07/22/15 0215 125 lb  (56.7 kg)     Height 07/22/15 0215 5\' 2"  (1.575 m)     Head Cir --      Peak Flow --      Pain Score --      Pain Loc --      Pain Edu? --      Excl. in [place]? --     Constitutional: Alert and oriented. Well appearing and in no distress. Eyes: Conjunctivae are normal. Normal extraocular movements. Gastrointestinal: Soft and nontender. No distention. No abdominal bruits.  Musculoskeletal: Left medial buttocks area is tender and painful with obvious rash. Neurologic:  Normal speech and language. No gross focal neurologic deficits are appreciated.  Skin:  Group vesicular rash noted on the medial left buttock area consistent with shingles Psychiatric: Mood and affect are normal.  ____________________________________________  ED COURSE:  Pertinent labs & imaging results that were available during my care of the patient were reviewed by me and considered in my medical decision making (see chart for details). Patient clinically with shingles, she'll be started on antiviral medicine as well as steroids and pain medicine. ____________________________________________    LABS (pertinent positives/negatives)  Labs Reviewed  CBC WITH DIFFERENTIAL/PLATELET - Abnormal; Notable for the following:    Hemoglobin 11.7 (*)    All other components within normal limits  BASIC METABOLIC PANEL - Abnormal; Notable for the following:    Sodium 134 (*)    Anion gap 3 (*)    All  other components within normal limits   ____________________________________________  FINAL ASSESSMENT AND PLAN  Shingles  Plan: Patient with labs as dictated above. Patient does report a childhood history of chickenpox. Findings are consistent with shingles. She'll be discharged antiviral medicine, pain medicine and steroids. She stable for outpatient follow-up.   Earleen Newport, MD   Earleen Newport, MD 07/22/15 [phone]

## 2015-07-24 IMAGING — US US OB COMP LESS 14 WK
1 series · 14 of 28 positions shown · non-contrast
Comparison: None.

CLINICAL DATA: Pain and vaginal bleeding.

EXAM:
OBSTETRIC <14 WK US AND TRANSVAGINAL OB US
TECHNIQUE: Both transabdominal and transvaginal ultrasound examinations were
performed for complete evaluation of the gestation as well as the
maternal uterus, adnexal regions, and pelvic cul-de-sac.
Transvaginal technique was performed to assess early pregnancy.

[Series 1: us ob comp less 14 wk · 0.23mm/px · 14 of 91 slices shown]
[im 4/91]
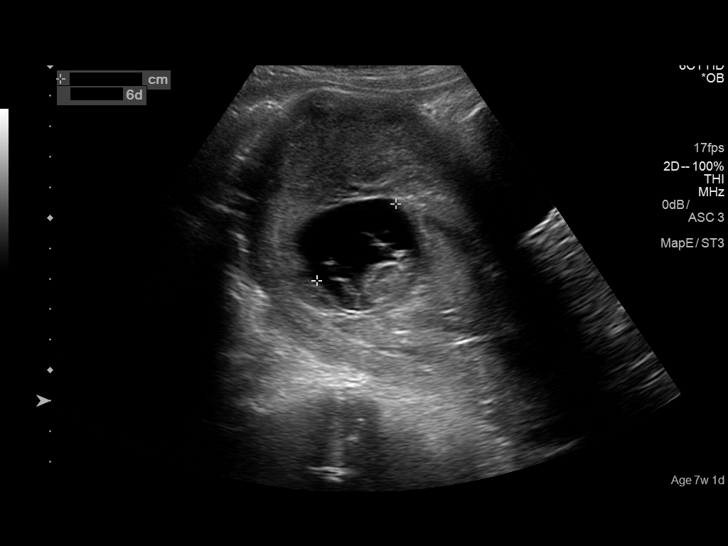
[im 11/91]
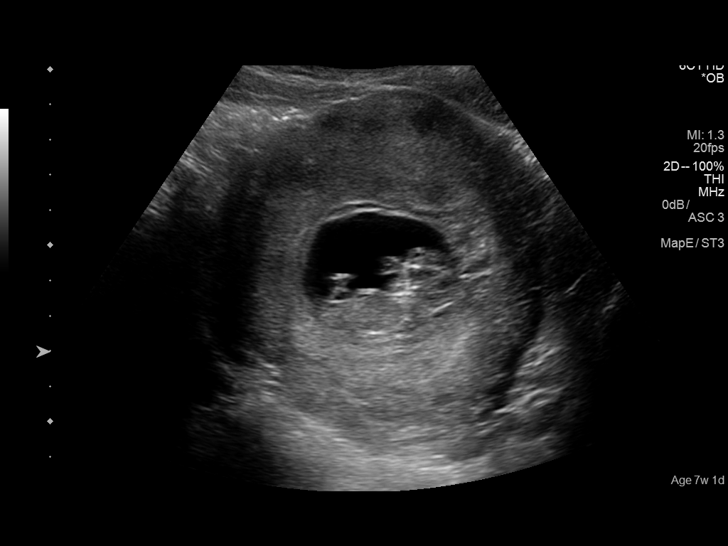
[im 17/91]
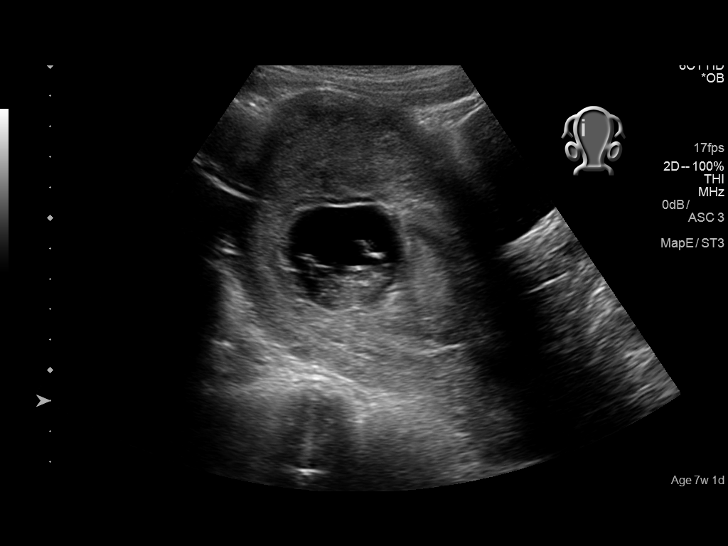
[im 24/91]
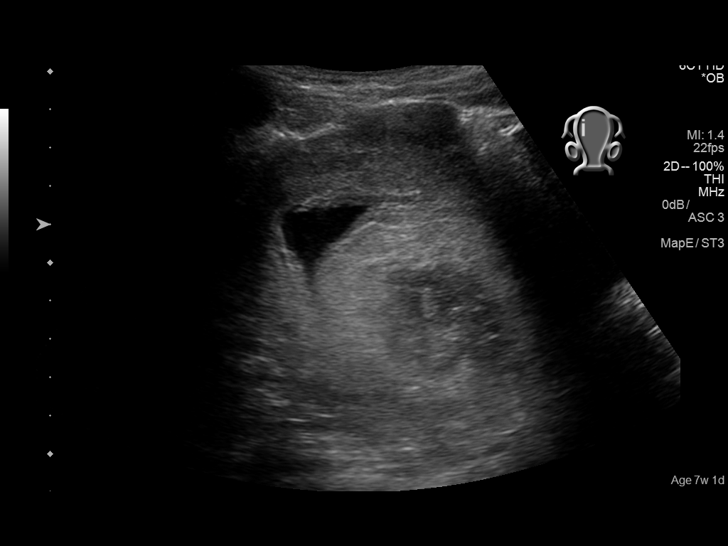
[im 31/91]
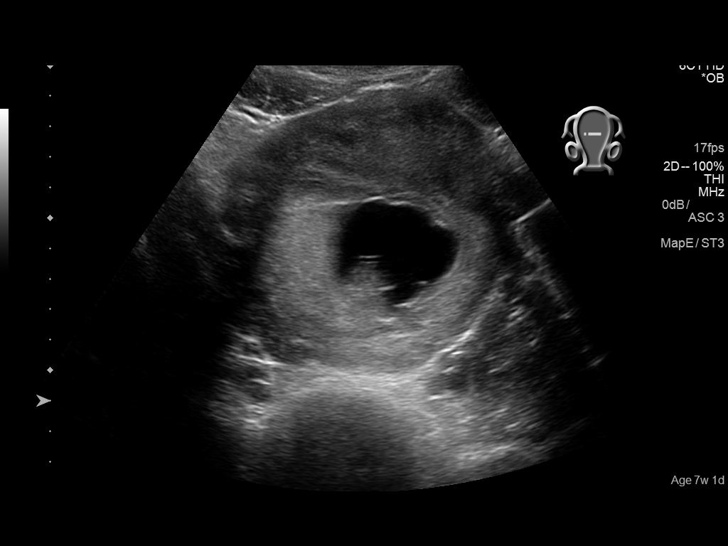
[im 37/91]
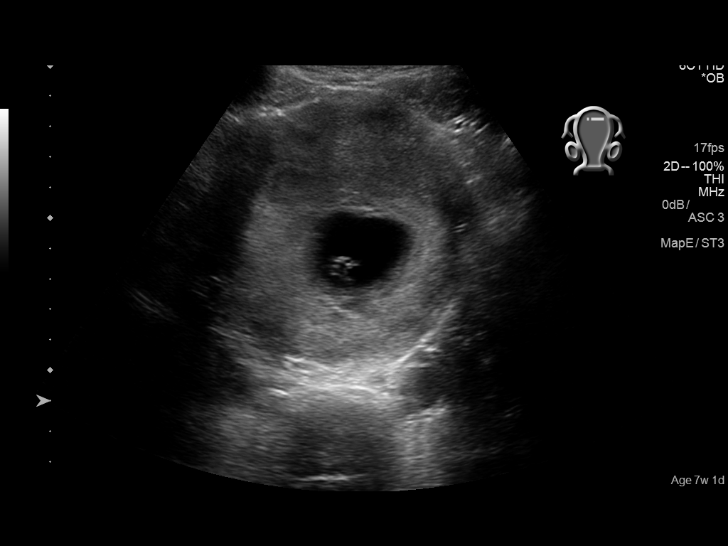
[im 44/91]
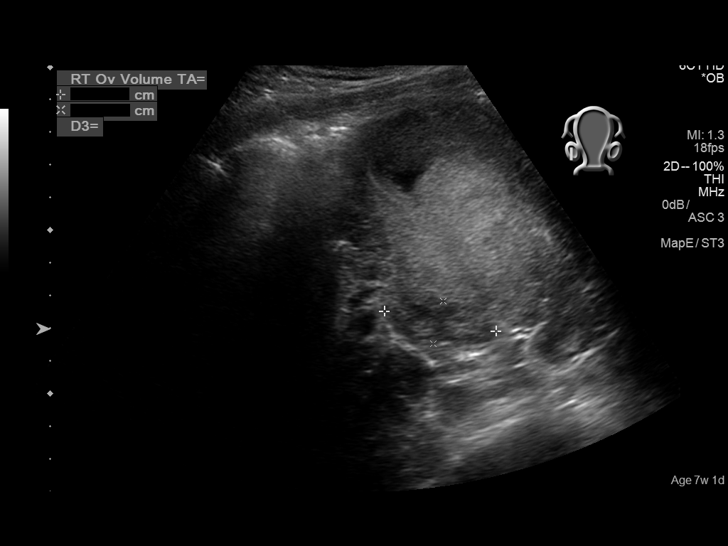
[im 51/91]
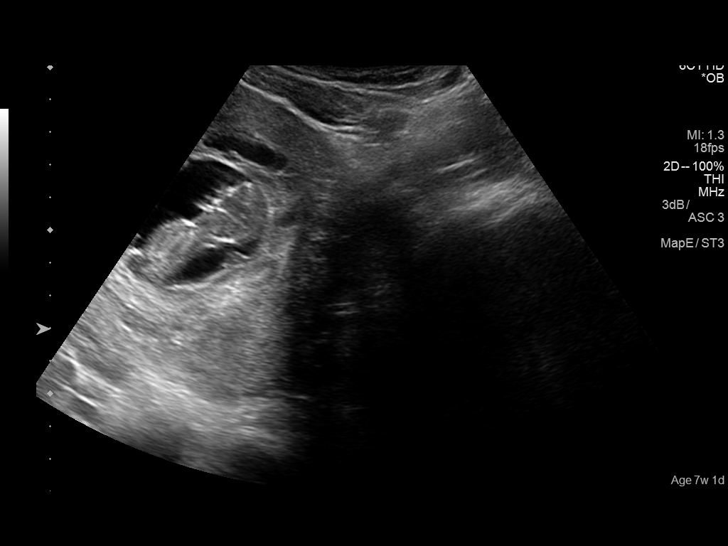
[im 57/91]
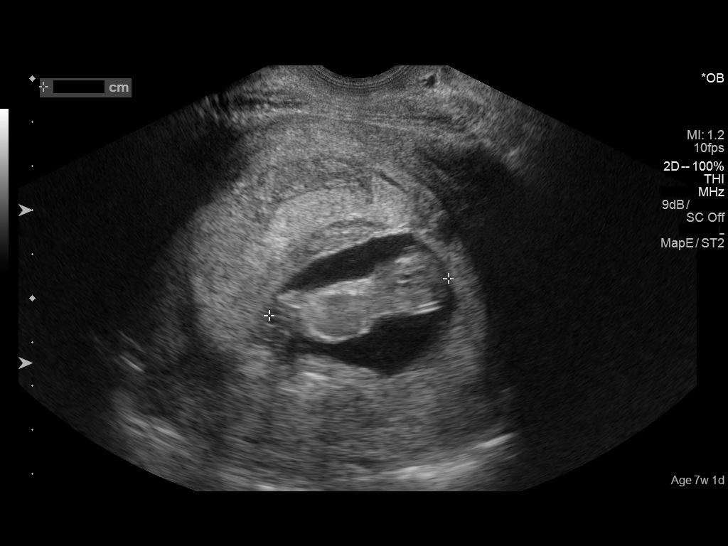
[im 64/91]
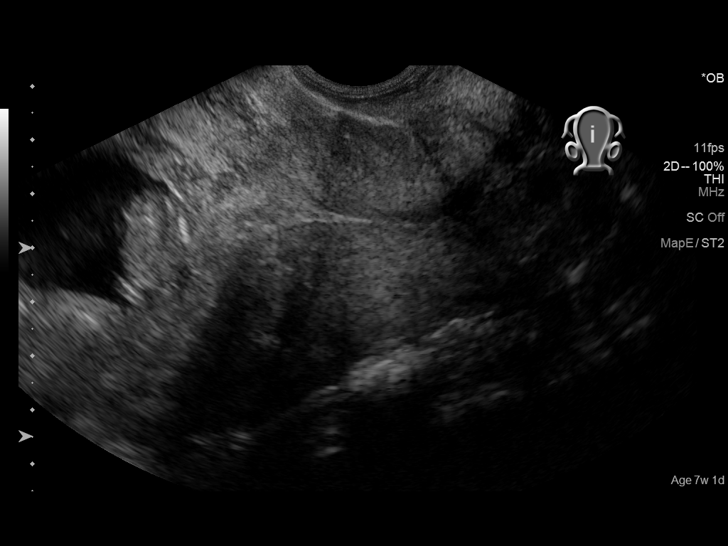
[im 71/91]
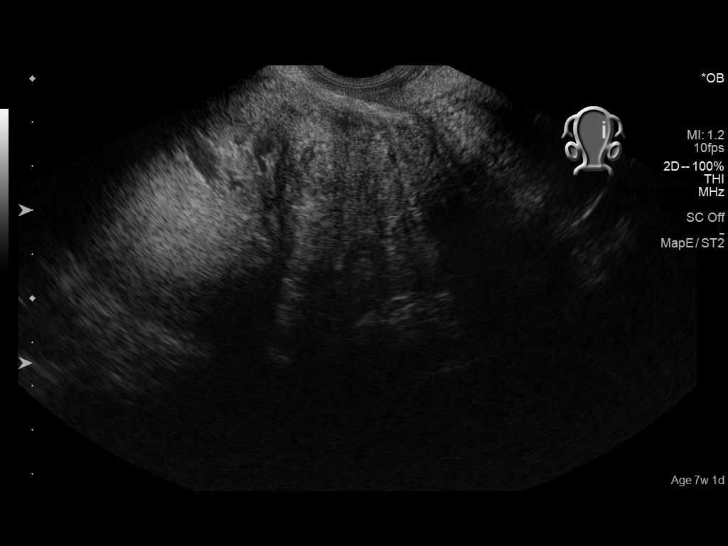
[im 77/91]
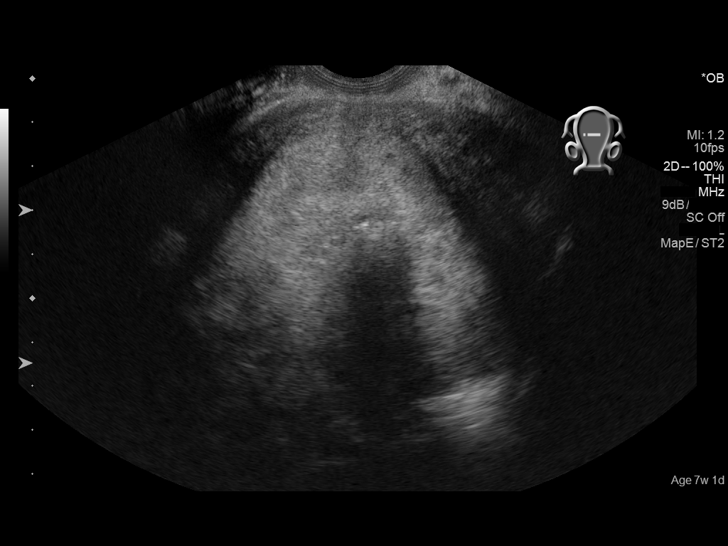
[im 84/91]
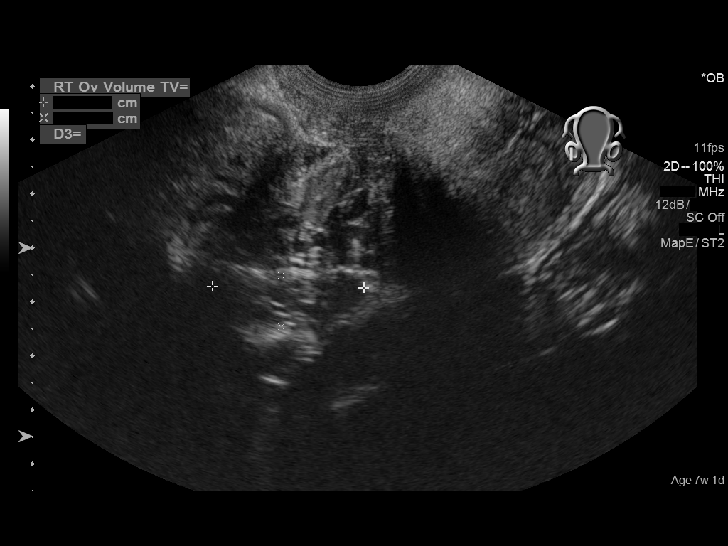
[im 91/91]
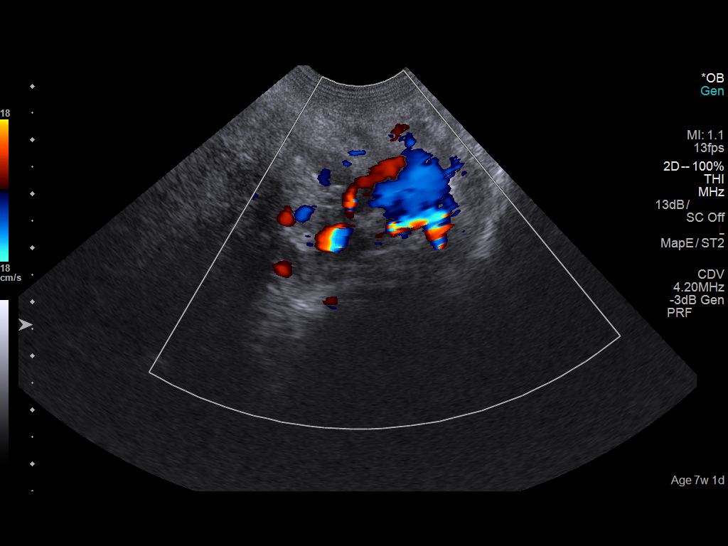

[14 of 28 positions shown; findings below may reference images not displayed]

FINDINGS: Intrauterine gestational sac: Present

Yolk sac:  Present

Embryo:  Present

Cardiac Activity: Present

Heart Rate: 168  bpm

CRL:  42  mm   11 w   1 d                  US EDC: [DATE]

Subchorionic hemorrhage: Moderate in size measuring 2.3 x 1.4 x
cm.

Maternal uterus/adnexae: Left ovary surgically absent. Two
subserosal myometrial masses are seen in the anterior body of the
uterus measuring 1.5 x 1.3 x 2.0 cm and 2.0 x 1.0 x 2.6 cm, likely
representing fibroids.
IMPRESSION: Single live intrauterine pregnancy corresponding to 11 weeks and 1
day gestation.

Moderate in size subchorionic hemorrhage.

Two subserosal fibroids within the anterior uterine body.

## 2015-12-03 ENCOUNTER — Encounter (HOSPITAL_COMMUNITY): Payer: Self-pay | Admitting: *Deleted

## 2015-12-03 ENCOUNTER — Emergency Department (HOSPITAL_COMMUNITY)
Admission: EM | Admit: 2015-12-03 | Discharge: 2015-12-03 | Disposition: A | Discharge status: Home / Self Care | Payer: Medicaid Other | Attending: Emergency Medicine | Admitting: Emergency Medicine

## 2015-12-03 DIAGNOSIS — M545 Low back pain, unspecified: Secondary | ICD-10-CM

## 2015-12-03 DIAGNOSIS — D539 Nutritional anemia, unspecified: Secondary | ICD-10-CM | POA: Insufficient documentation

## 2015-12-03 DIAGNOSIS — N939 Abnormal uterine and vaginal bleeding, unspecified: Secondary | ICD-10-CM | POA: Insufficient documentation

## 2015-12-03 DIAGNOSIS — N898 Other specified noninflammatory disorders of vagina: Secondary | ICD-10-CM | POA: Diagnosis not present

## 2015-12-03 DIAGNOSIS — D649 Anemia, unspecified: Secondary | ICD-10-CM

## 2015-12-03 DIAGNOSIS — R110 Nausea: Secondary | ICD-10-CM | POA: Diagnosis not present

## 2015-12-03 DIAGNOSIS — R1030 Lower abdominal pain, unspecified: Secondary | ICD-10-CM

## 2015-12-03 LAB — CBC
HCT: 35.9 % — ABNORMAL LOW (ref 36.0–46.0)
Hemoglobin: 11.3 g/dL — ABNORMAL LOW (ref 12.0–15.0)
MCH: 25.9 pg — ABNORMAL LOW (ref 26.0–34.0)
MCHC: 31.5 g/dL (ref 30.0–36.0)
MCV: 82.3 fL (ref 78.0–100.0)
Platelets: 255 K/uL (ref 150–400)
RBC: 4.36 MIL/uL (ref 3.87–5.11)
RDW: 14 % (ref 11.5–15.5)
WBC: 5.7 K/uL (ref 4.0–10.5)

## 2015-12-03 LAB — COMPREHENSIVE METABOLIC PANEL WITH GFR
ALT: 25 U/L (ref 14–54)
AST: 27 U/L (ref 15–41)
Albumin: 3.8 g/dL (ref 3.5–5.0)
Alkaline Phosphatase: 58 U/L (ref 38–126)
Anion gap: 10 (ref 5–15)
BUN: 11 mg/dL (ref 6–20)
CO2: 25 mmol/L (ref 22–32)
Calcium: 9.7 mg/dL (ref 8.9–10.3)
Chloride: 103 mmol/L (ref 101–111)
Creatinine, Ser: 0.83 mg/dL (ref 0.44–1.00)
GFR calc Af Amer: >60 mL/min (ref >60)
GFR calc non Af Amer: >60 mL/min (ref >60)
Glucose, Bld: 86 mg/dL (ref 65–99)
Potassium: 3.8 mmol/L (ref 3.5–5.1)
Sodium: 138 mmol/L (ref 135–145)
Total Bilirubin: 0.7 mg/dL (ref 0.3–1.2)
Total Protein: 7.5 g/dL (ref 6.5–8.1)

## 2015-12-03 LAB — URINALYSIS, ROUTINE W REFLEX MICROSCOPIC
BILIRUBIN URINE: NEGATIVE
Glucose, UA: NEGATIVE mg/dL
Hgb urine dipstick: NEGATIVE
Ketones, ur: NEGATIVE mg/dL
Leukocytes, UA: NEGATIVE
NITRITE: NEGATIVE
PH: 6.5 (ref 5.0–8.0)
Protein, ur: NEGATIVE mg/dL
SPECIFIC GRAVITY, URINE: 1.029 (ref 1.005–1.030)

## 2015-12-03 LAB — I-STAT BETA HCG BLOOD, ED (MC, WL, AP ONLY): I-stat hCG, quantitative: <5 m[IU]/mL (ref <5)

## 2015-12-03 LAB — LIPASE, BLOOD: Lipase: 25 U/L (ref 11–51)

## 2015-12-03 MED ORDER — NAPROXEN 500 MG PO TABS: 500.0000 mg | ORAL_TABLET | Freq: Two times a day (BID) | ORAL | 0 refills | Status: DC | PRN

## 2015-12-03 MED ORDER — HYDROCODONE-ACETAMINOPHEN 5-325 MG PO TABS: 1.0000 | ORAL_TABLET | Freq: Four times a day (QID) | ORAL | 0 refills | Status: DC | PRN

## 2015-12-03 MED ORDER — HYDROCODONE-ACETAMINOPHEN 5-325 MG PO TABS
1.0000 | ORAL_TABLET | Freq: Once | ORAL | Status: AC
  Administered 2015-12-03: 1 via ORAL
  Filled 2015-12-03: qty 1

## 2015-12-03 MED ORDER — NAPROXEN 250 MG PO TABS
500.0000 mg | ORAL_TABLET | Freq: Once | ORAL | Status: DC
  Filled 2015-12-03: qty 2

## 2015-12-03 NOTE — ED Provider Notes (Signed)
[facility] DEPT Provider Note   CSN: [id number] Arrival date & time: 12/03/15  1324     History   Chief Complaint Chief Complaint  Patient presents with  . Abdominal Pain  . Back Pain    HPI Heidi Mata is a 30 y.o. female with a PMHx of hemosiderosis and migraines, with a PSHx of ectopic pregnancy surgery, who presents to the ED with complaints of lower back pain that began yesterday. She describes the pain is 6/10 intermittent aching low back pain radiating into her lower abdomen, worse with sitting, with no treatments tried prior to arrival. Associated symptoms include nausea and 1 episode of vaginal bleeding that she describes as spotting that occurred when she wiped after using the restroom earlier. States that it was bright red blood, she has not needed any pads or tampons, no passage of clots, and has not continued since that one episode earlier. She also reports that she has had vaginal discharge since earlier in the week, was seen by her PCP and was given a yeast infection medication by mouth that she took, but has not started Monistat was given to her. Describes the vaginal discharge as clear. States that it is improving since taking the oral yeast infection medicine. She did not have a pelvic exam on the date of her visit with her PCP.  She denies any fevers, chills, chest pain, shortness breath, vomiting, diarrhea, constipation, obstipation, melena, hematochezia, dysuria, hematuria, flank pain, vaginal itching, genital sores, numbness, tingling, focal weakness, recent travel, sick contacts, suspicious food intake, alcohol use, chronic NSAID use, or recent antibiotics. LMP was 1 week ago although she states that it was shorter than normal. She is sexually active with one female partner, unprotected. Denies history of kidney stones.   The history is provided by the patient and medical records. No language interpreter was used.  Abdominal Pain   Associated symptoms include  nausea. Pertinent negatives include fever, diarrhea, vomiting, constipation, dysuria, hematuria, arthralgias and myalgias.  Back Pain   This is a new problem. The current episode started yesterday. Episode frequency: intermittent. The problem has not changed since onset.The pain is present in the lumbar spine. The quality of the pain is described as aching. Radiates to: lower abdomen. The pain is at a severity of 6/10. The pain is moderate. The symptoms are aggravated by certain positions. Associated symptoms include abdominal pain. Pertinent negatives include no chest pain, no fever, no numbness, no bowel incontinence, no perianal numbness, no bladder incontinence, no dysuria, no paresthesias, no tingling and no weakness. She has tried nothing for the symptoms. The treatment provided no relief.    Past Medical History:  Diagnosis Date  . Hemosiderosis   . Migraines     There are no active problems to display for this patient.   Past Surgical History:  Procedure Laterality Date  . BREAST SURGERY     cyst removal, right  . CYST EXCISION    . ECTOPIC PREGNANCY SURGERY  2009  . LAPAROSCOPY FOR ECTOPIC PREGNANCY      OB History    No data available       Home Medications    Prior to Admission medications   Medication Sig Start Date End Date Taking? Authorizing Provider  acyclovir (ZOVIRAX) 800 MG tablet Take 1 tablet (800 mg total) by mouth 5 (five) times daily. 07/22/15   Earleen Newport, MD  HYDROcodone-acetaminophen (NORCO/VICODIN) 5-325 MG tablet Take 1 tablet by mouth every 4 (four) hours as needed  for moderate pain. 01/31/15   Johnn Hai, PA-C  naproxen (NAPROSYN) 500 MG tablet Take 1 tablet (500 mg total) by mouth 2 (two) times daily with a meal. 01/31/15   Johnn Hai, PA-C  oxyCODONE-acetaminophen (PERCOCET) 5-325 MG tablet Take 2 tablets by mouth every 6 (six) hours as needed for moderate pain or severe pain. 07/22/15   Earleen Newport, MD  predniSONE  (STERAPRED UNI-PAK 21 TAB) 10 MG (21) TBPK tablet Take 6 tablets on day 1 Take 5 tablets on day 2 Take 4 tablets on day 3 Take 3 tablets on day 4 Take 2 tablets on day 5 Take 1 tablet on day 6 Patient not taking: Reported on 08/30/2014 08/24/14   Victorino Dike, FNP  predniSONE (STERAPRED UNI-PAK 21 TAB) 10 MG (21) TBPK tablet Take steroid taper pak as directed 07/22/15   Earleen Newport, MD  PRESCRIPTION MEDICATION as needed (for migraines).    Historical Provider, MD  tiZANidine (ZANAFLEX) 4 MG capsule Take 1 capsule (4 mg total) by mouth 3 (three) times daily as needed (headaches). 12/10/14   Jennifer Piepenbrink, PA-C  trimethoprim-polymyxin b (POLYTRIM) ophthalmic solution Place 1 drop into the left eye every 4 (four) hours. X 5 days 12/10/14   Baron Sane, PA-C  Vitamins A & D (RA VITAMIN A & D) OINT Apply 1 application topically 3 (three) times daily. Patient not taking: Reported on 08/30/2014 08/24/14   Victorino Dike, FNP    Family History History reviewed. No pertinent family history.  Social History Social History  Substance Use Topics  . Smoking status: Never Smoker  . Smokeless tobacco: Not on file  . Alcohol use No     Allergies   Lactose intolerance (gi) and Sulfa antibiotics   Review of Systems Review of Systems  Constitutional: Negative for chills and fever.  Respiratory: Negative for shortness of breath.   Cardiovascular: Negative for chest pain.  Gastrointestinal: Positive for abdominal pain and nausea. Negative for blood in stool, bowel incontinence, constipation, diarrhea and vomiting.  Genitourinary: Positive for vaginal bleeding (spotting once today) and vaginal discharge (clear, improving). Negative for bladder incontinence, dysuria, flank pain, genital sores, hematuria and vaginal pain.  Musculoskeletal: Positive for back pain. Negative for arthralgias and myalgias.  Skin: Negative for color change.  Allergic/Immunologic: Negative for  immunocompromised state.  Neurological: Negative for tingling, weakness, numbness and paresthesias.  Psychiatric/Behavioral: Negative for confusion.   10 Systems reviewed and are negative for acute change except as noted in the HPI.   Physical Exam Updated Vital Signs BP 110/65 (BP Location: Right Arm)   Pulse 86   Temp 98.4 F (36.9 C) (Oral)   Resp 20   LMP 11/26/2015   SpO2 100%   Physical Exam  Constitutional: She is oriented to person, place, and time. Vital signs are normal. She appears well-developed and well-nourished.  Non-toxic appearance. No distress.  Afebrile, nontoxic, NAD  HENT:  Head: Normocephalic and atraumatic.  Mouth/Throat: Oropharynx is clear and moist and mucous membranes are normal.  Eyes: Conjunctivae and EOM are normal. Right eye exhibits no discharge. Left eye exhibits no discharge.  Neck: Normal range of motion. Neck supple.  Cardiovascular: Normal rate, regular rhythm, normal heart sounds and intact distal pulses.  Exam reveals no gallop and no friction rub.   No murmur heard. Pulmonary/Chest: Effort normal and breath sounds normal. No respiratory distress. She has no decreased breath sounds. She has no wheezes. She has no rhonchi. She has no rales.  Abdominal: Soft. Normal appearance and bowel sounds are normal. She exhibits no distension. There is tenderness in the suprapubic area. There is no rigidity, no rebound, no guarding, no CVA tenderness, no tenderness at McBurney's point and negative Murphy's sign.  Soft, nondistended, +BS throughout, with very minimal suprapubic TTP, no r/g/r, neg murphy's, neg mcburney's, no CVA TTP   Genitourinary:  Genitourinary Comments: Deferred due to pt preference  Musculoskeletal: Normal range of motion.  Neurological: She is alert and oriented to person, place, and time. She has normal strength. No sensory deficit.  Skin: Skin is warm, dry and intact. No rash noted.  Psychiatric: She has a normal mood and affect.    Nursing note and vitals reviewed.    ED Treatments / Results  Labs (all labs ordered are listed, but only abnormal results are displayed) Labs Reviewed  CBC - Abnormal; Notable for the following:       Result Value   Hemoglobin 11.3 (*)    HCT 35.9 (*)    MCH 25.9 (*)    All other components within normal limits  LIPASE, BLOOD  COMPREHENSIVE METABOLIC PANEL  URINALYSIS, ROUTINE W REFLEX MICROSCOPIC (NOT AT [facility])  I-STAT BETA HCG BLOOD, ED ([facility], [facility], [facility] ONLY)    EKG  EKG Interpretation None       Radiology No results found.  Procedures Procedures (including critical care time)  Medications Ordered in ED Medications  naproxen (NAPROSYN) tablet 500 mg (500 mg Oral Not Given 12/03/15 1941)  HYDROcodone-acetaminophen (NORCO/VICODIN) 5-325 MG per tablet 1 tablet (1 tablet Oral Given 12/03/15 1939)     Initial Impression / Assessment and Plan / ED Course  I have reviewed the triage vital signs and the nursing notes.  Pertinent labs & imaging results that were available during my care of the patient were reviewed by me and considered in my medical decision making (see chart for details).  Clinical Course    30 y.o. female here with 1 day of low back pain radiating to lower abdomen, some nausea, and 1 episode of bright red vaginal spotting/bleeding. Had vaginal discharge earlier in the week, was give diflucan and took that, hasn't started monistat, states discharge is improving. Labs today completely unremarkable, including neg U/A without evidence of hematuria or infection. Abdominal exam reveals very mild suprapubic tenderness, nonperitoneal. Discussed we could proceed with pelvic exam, but pt declined, states she'd rather do that at her PCP's office when she follows up with them. Doubt ovarian torsion given minimal tenderness and it's more midline, not unilateral, but can't fully r/o without pelvic exam, discussed r/b/a and pt still wants to just go see her PCP for the pelvic  exam. Doubt need for CT imaging, doubt appendicitis or other emergent etiology of symptoms. Will give pain meds, discussed f/up with PCP this week. Also discussed using monistat to see if this helps her symptoms. I explained the diagnosis and have given explicit precautions to return to the ER including for any other new or worsening symptoms. The patient understands and accepts the medical plan as it's been dictated and I have answered their questions. Discharge instructions concerning home care and prescriptions have been given. The patient is STABLE and is discharged to home in good condition.   Final Clinical Impressions(s) / ED Diagnoses   Final diagnoses:  Bilateral low back pain without sciatica  Lower abdominal pain  Abnormal uterine bleeding  Nausea  Vaginal discharge  Chronic anemia    New Prescriptions New Prescriptions  HYDROCODONE-ACETAMINOPHEN (NORCO) 5-325 MG TABLET    Take 1 tablet by mouth every 6 (six) hours as needed for severe pain.   NAPROXEN (NAPROSYN) 500 MG TABLET    Take 1 tablet (500 mg total) by mouth 2 (two) times daily as needed for mild pain, moderate pain or headache (TAKE WITH MEALS.).     [address] [place], PA-C 12/03/15 1942    Forde Dandy, MD 12/04/15 [phone]

## 2015-12-03 NOTE — Discharge Instructions (Signed)
Abdominal (belly) pain can be caused by many things. Yours could be related to ovarian cysts or the yeast infection you had earlier this week. Take monistat as instructed by your regular doctor. Use naprosyn and norco as directed as needed for pain but don't drive while taking norco. Follow up with your regular doctor in 5-7 days for recheck of symptoms and ongoing evaluation. Return to the ER for changes or worsening symptoms.  Your caregiver performed an examination and possibly ordered blood/urine tests and imaging (CT scan, x-rays, ultrasound). Many cases can be observed and treated at home after initial evaluation in the emergency department. Even though you are being discharged home, abdominal pain can be unpredictable. Therefore, you need a repeated exam if your pain does not resolve, returns, or worsens. Most patients with abdominal pain don't have to be admitted to the hospital or have surgery, but serious problems like appendicitis and gallbladder attacks can start out as nonspecific pain. Many abdominal conditions cannot be diagnosed in one visit, so follow-up evaluations are very important. SEEK IMMEDIATE MEDICAL ATTENTION IF YOU DEVELOP ANY OF THE FOLLOWING SYMPTOMS: The pain does not go away or becomes severe.  A temperature above 101 develops.  Repeated vomiting occurs (multiple episodes).  The pain becomes localized to portions of the abdomen. The right side could possibly be appendicitis. In an adult, the left lower portion of the abdomen could be colitis or diverticulitis.  Blood is being passed in stools or vomit (bright red or black tarry stools).  Return also if you develop chest pain, difficulty breathing, dizziness or fainting, or become confused, poorly responsive, or inconsolable (young children). The constipation stays for more than 4 days.  There is belly (abdominal) or rectal pain.  You do not seem to be getting better.

## 2015-12-03 NOTE — ED Triage Notes (Signed)
Pt reports abd pain, back pain and spotting since yesterday.

## 2015-12-09 ENCOUNTER — Encounter: Payer: Self-pay | Admitting: Emergency Medicine

## 2015-12-09 DIAGNOSIS — L02214 Cutaneous abscess of groin: Secondary | ICD-10-CM | POA: Insufficient documentation

## 2015-12-09 NOTE — ED Triage Notes (Signed)
Patient states that she developed an abscess to her groin 2-3 days ago. Patient reports that it had some drainage yesterday in the shower but none today.

## 2015-12-10 ENCOUNTER — Emergency Department
Admission: EM | Admit: 2015-12-10 | Discharge: 2015-12-10 | Disposition: A | Discharge status: Home / Self Care | Payer: Medicaid Other | Attending: Emergency Medicine | Admitting: Emergency Medicine

## 2015-12-10 DIAGNOSIS — L0291 Cutaneous abscess, unspecified: Secondary | ICD-10-CM

## 2015-12-10 MED ORDER — LIDOCAINE-EPINEPHRINE-TETRACAINE (LET) SOLUTION
3.0000 mL | Freq: Once | NASAL | Status: AC
  Administered 2015-12-10: 3 mL via TOPICAL
  Filled 2015-12-10: qty 3

## 2015-12-10 MED ORDER — IBUPROFEN 800 MG PO TABS: 800.0000 mg | ORAL_TABLET | Freq: Three times a day (TID) | ORAL | 0 refills | Status: DC | PRN

## 2015-12-10 MED ORDER — LIDOCAINE HCL (PF) 1 % IJ SOLN
INTRAMUSCULAR | Status: AC
  Administered 2015-12-10: 10 mL
  Filled 2015-12-10: qty 5

## 2015-12-10 MED ORDER — OXYCODONE-ACETAMINOPHEN 5-325 MG PO TABS: 1.0000 | ORAL_TABLET | ORAL | 0 refills | Status: DC | PRN

## 2015-12-10 MED ORDER — DOXYCYCLINE HYCLATE 50 MG PO CAPS: 100.0000 mg | ORAL_CAPSULE | Freq: Two times a day (BID) | ORAL | 0 refills | Status: DC

## 2015-12-10 MED ORDER — ONDANSETRON 4 MG PO TBDP
4.0000 mg | ORAL_TABLET | Freq: Once | ORAL | Status: AC
Start: 2015-12-10 — End: 2015-12-10
  Administered 2015-12-10: 4 mg via ORAL
  Filled 2015-12-10: qty 1

## 2015-12-10 MED ORDER — MORPHINE SULFATE (PF) 4 MG/ML IV SOLN
4.0000 mg | Freq: Once | INTRAVENOUS | Status: AC
  Administered 2015-12-10: 4 mg via INTRAMUSCULAR
  Filled 2015-12-10: qty 1

## 2015-12-10 MED ORDER — DOXYCYCLINE HYCLATE 100 MG PO TABS
100.0000 mg | ORAL_TABLET | Freq: Once | ORAL | Status: AC
  Administered 2015-12-10: 100 mg via ORAL
  Filled 2015-12-10: qty 1

## 2015-12-10 NOTE — ED Provider Notes (Signed)
[facility] Emergency Department Provider Note   ____________________________________________   First MD Initiated Contact with Patient 12/10/15 [phone]     (approximate)  I have reviewed the triage vital signs and the nursing notes.   HISTORY  Chief Complaint Abscess    HPI Heidi Mata is a 30 y.o. female who presents to the ED from home with a chief complaint of groin abscess. Patient noted developing area approximately 3 days ago. States it had some drainage yesterday in the shower but none since. Denies associated fever, chills, chest pain, shortness breath, abdominal pain, nausea, vomiting, diarrhea. Denies recent travel or trauma. Nothing makes her symptoms better or worse.   Past Medical History:  Diagnosis Date  . Hemosiderosis   . Migraines     There are no active problems to display for this patient.   Past Surgical History:  Procedure Laterality Date  . BREAST SURGERY     cyst removal, right  . CYST EXCISION    . ECTOPIC PREGNANCY SURGERY  2009  . LAPAROSCOPY FOR ECTOPIC PREGNANCY      Prior to Admission medications   Medication Sig Start Date End Date Taking? Authorizing Provider  acyclovir (ZOVIRAX) 800 MG tablet Take 1 tablet (800 mg total) by mouth 5 (five) times daily. 07/22/15   Earleen Newport, MD  HYDROcodone-acetaminophen (NORCO) 5-325 MG tablet Take 1 tablet by mouth every 6 (six) hours as needed for severe pain. 12/03/15   Mercedes Camprubi-Soms, PA-C  HYDROcodone-acetaminophen (NORCO/VICODIN) 5-325 MG tablet Take 1 tablet by mouth every 4 (four) hours as needed for moderate pain. 01/31/15   Johnn Hai, PA-C  naproxen (NAPROSYN) 500 MG tablet Take 1 tablet (500 mg total) by mouth 2 (two) times daily with a meal. 01/31/15   Johnn Hai, PA-C  naproxen (NAPROSYN) 500 MG tablet Take 1 tablet (500 mg total) by mouth 2 (two) times daily as needed for mild pain, moderate pain or headache (TAKE WITH MEALS.). 12/03/15    Mercedes Camprubi-Soms, PA-C  oxyCODONE-acetaminophen (PERCOCET) 5-325 MG tablet Take 2 tablets by mouth every 6 (six) hours as needed for moderate pain or severe pain. 07/22/15   Earleen Newport, MD  predniSONE (STERAPRED UNI-PAK 21 TAB) 10 MG (21) TBPK tablet Take 6 tablets on day 1 Take 5 tablets on day 2 Take 4 tablets on day 3 Take 3 tablets on day 4 Take 2 tablets on day 5 Take 1 tablet on day 6 Patient not taking: Reported on 08/30/2014 08/24/14   Victorino Dike, FNP  predniSONE (STERAPRED UNI-PAK 21 TAB) 10 MG (21) TBPK tablet Take steroid taper pak as directed 07/22/15   Earleen Newport, MD  PRESCRIPTION MEDICATION as needed (for migraines).    Historical Provider, MD  tiZANidine (ZANAFLEX) 4 MG capsule Take 1 capsule (4 mg total) by mouth 3 (three) times daily as needed (headaches). 12/10/14   Jennifer Piepenbrink, PA-C  trimethoprim-polymyxin b (POLYTRIM) ophthalmic solution Place 1 drop into the left eye every 4 (four) hours. X 5 days 12/10/14   Baron Sane, PA-C  Vitamins A & D (RA VITAMIN A & D) OINT Apply 1 application topically 3 (three) times daily. Patient not taking: Reported on 08/30/2014 08/24/14   Victorino Dike, FNP    Allergies Lactose intolerance (gi) and Sulfa antibiotics  No family history on file.  Social History Social History  Substance Use Topics  . Smoking status: Never Smoker  . Smokeless tobacco: Never Used  . Alcohol  use No    Review of Systems  Constitutional: No fever/chills. Eyes: No visual changes. ENT: No sore throat. Cardiovascular: Denies chest pain. Respiratory: Denies shortness of breath. Gastrointestinal: No abdominal pain.  No nausea, no vomiting.  No diarrhea.  No constipation. Genitourinary: Negative for dysuria. Musculoskeletal: Negative for back pain. Skin: Positive for groin abscess. Neurological: Negative for headaches, focal weakness or numbness.  10-point ROS otherwise  negative.  ____________________________________________   PHYSICAL EXAM:  VITAL SIGNS: ED Triage Vitals  Enc Vitals Group     BP 12/09/15 2234 124/68     Pulse Rate 12/09/15 2234 100     Resp 12/09/15 2234 18     Temp 12/09/15 2234 98.3 F (36.8 C)     Temp Source 12/09/15 2234 Oral     SpO2 12/09/15 2234 100 %     Weight 12/09/15 2235 157 lb (71.2 kg)     Height 12/09/15 2235 5\' 2"  (1.575 m)     Head Circumference --      Peak Flow --      Pain Score 12/09/15 2236 7     Pain Loc --      Pain Edu? --      Excl. in [place]? --     Constitutional: Alert and oriented. Well appearing and in no acute distress. Eyes: Conjunctivae are normal. PERRL. EOMI. Head: Atraumatic. Nose: No congestion/rhinnorhea. Mouth/Throat: Mucous membranes are moist.  Oropharynx non-erythematous. Neck: No stridor.   Cardiovascular: Normal rate, regular rhythm. Grossly normal heart sounds.  Good peripheral circulation. Respiratory: Normal respiratory effort.  No retractions. Lungs CTAB. Gastrointestinal: Soft and nontender. No distention. No abdominal bruits. No CVA tenderness. Genitourinary: Approximately 2 cm area of fluctuance to right groin adjacent to the vulva. There is some surrounding indurated tissue. This is not a vulvar abscess or Bartholin's cyst. Musculoskeletal: No lower extremity tenderness nor edema.  No joint effusions. Neurologic:  Normal speech and language. No gross focal neurologic deficits are appreciated. No gait instability. Skin:  Skin is warm, dry and intact. No rash noted. Psychiatric: Mood and affect are normal. Speech and behavior are normal.  ____________________________________________   LABS (all labs ordered are listed, but only abnormal results are displayed)  Labs Reviewed - No data to  display ____________________________________________  EKG  None ____________________________________________  RADIOLOGY  None ____________________________________________   PROCEDURES  Procedure(s) performed:  INCISION AND DRAINAGE Performed by: Paulette Blanch Consent: Verbal consent obtained. Risks and benefits: risks, benefits and alternatives were discussed Type: abscess  Body area: Right groin  Anesthesia: local infiltration  Incision was made with a scalpel.  Local anesthetic: lidocaine 1% w/o epinephrine  Anesthetic total: 8 ml  Complexity: complex Blunt dissection to break up loculations  Drainage: purulent  Drainage amount: moderate  Packing material: 1/4 in iodoform gauze  Patient tolerance: Patient tolerated the procedure well with no immediate complications.    Procedures  Critical Care performed: No  ____________________________________________   INITIAL IMPRESSION / ASSESSMENT AND PLAN / ED COURSE  Pertinent labs & imaging results that were available during my care of the patient were reviewed by me and considered in my medical decision making (see chart for details).  30 year old female who presents with right groin abscess requiring incision and drainage. Patient tolerated procedure well. Will continue doxycycline and patient will follow-up with her PCP next week. Strict return precautions given. Patient verbalizes understanding and agrees with plan of care.  Clinical Course     ____________________________________________   FINAL CLINICAL IMPRESSION(S) /  ED DIAGNOSES  Final diagnoses:  Abscess      NEW MEDICATIONS STARTED DURING THIS VISIT:  New Prescriptions   No medications on file     Note:  This document was prepared using Dragon voice recognition software and may include unintentional dictation errors.    Paulette Blanch, MD 12/10/15 [phone]

## 2015-12-10 NOTE — Discharge Instructions (Signed)
1. Take antibiotic as prescribed (Doxycycline 100mg  twice daily x 7 days). 2. You may take pain medicines as needed (Motrin/Percocet #15). 3. Return to the ER for worsening symptoms, persistent vomiting, fever or other concerns.

## 2015-12-10 NOTE — ED Notes (Signed)
Incision covered by Curly Rim.

## 2016-03-21 ENCOUNTER — Emergency Department
Admission: EM | Admit: 2016-03-21 | Discharge: 2016-03-21 | Disposition: A | Discharge status: Home / Self Care | Payer: Medicaid Other | Attending: Emergency Medicine | Admitting: Emergency Medicine

## 2016-03-21 ENCOUNTER — Encounter: Payer: Self-pay | Admitting: Emergency Medicine

## 2016-03-21 ENCOUNTER — Emergency Department: Payer: Medicaid Other

## 2016-03-21 DIAGNOSIS — N739 Female pelvic inflammatory disease, unspecified: Secondary | ICD-10-CM | POA: Insufficient documentation

## 2016-03-21 DIAGNOSIS — Z79899 Other long term (current) drug therapy: Secondary | ICD-10-CM | POA: Insufficient documentation

## 2016-03-21 DIAGNOSIS — N730 Acute parametritis and pelvic cellulitis: Secondary | ICD-10-CM

## 2016-03-21 DIAGNOSIS — R1031 Right lower quadrant pain: Secondary | ICD-10-CM | POA: Diagnosis present

## 2016-03-21 LAB — URINALYSIS COMPLETE WITH MICROSCOPIC (ARMC ONLY)
BACTERIA UA: NONE SEEN
BILIRUBIN URINE: NEGATIVE
Glucose, UA: NEGATIVE mg/dL
HGB URINE DIPSTICK: NEGATIVE
KETONES UR: NEGATIVE mg/dL
LEUKOCYTES UA: NEGATIVE
NITRITE: NEGATIVE
PH: 6 (ref 5.0–8.0)
Protein, ur: NEGATIVE mg/dL
Specific Gravity, Urine: 1.016 (ref 1.005–1.030)

## 2016-03-21 LAB — WET PREP, GENITAL
SPERM: NONE SEEN
TRICH WET PREP: NONE SEEN
YEAST WET PREP: NONE SEEN

## 2016-03-21 LAB — COMPREHENSIVE METABOLIC PANEL
ALT: 15 U/L (ref 14–54)
AST: 28 U/L (ref 15–41)
Albumin: 4.4 g/dL (ref 3.5–5.0)
Alkaline Phosphatase: 60 U/L (ref 38–126)
Anion gap: 7 (ref 5–15)
BILIRUBIN TOTAL: 0.9 mg/dL (ref 0.3–1.2)
BUN: 14 mg/dL (ref 6–20)
CHLORIDE: 105 mmol/L (ref 101–111)
CO2: 26 mmol/L (ref 22–32)
Calcium: 9.8 mg/dL (ref 8.9–10.3)
Creatinine, Ser: 1.01 mg/dL — ABNORMAL HIGH (ref 0.44–1.00)
Glucose, Bld: 85 mg/dL (ref 65–99)
POTASSIUM: 3.4 mmol/L — AB (ref 3.5–5.1)
Sodium: 138 mmol/L (ref 135–145)
TOTAL PROTEIN: 8.4 g/dL — AB (ref 6.5–8.1)

## 2016-03-21 LAB — CBC
HEMATOCRIT: 36 % (ref 35.0–47.0)
Hemoglobin: 12.1 g/dL (ref 12.0–16.0)
MCH: 27.4 pg (ref 26.0–34.0)
MCHC: 33.5 g/dL (ref 32.0–36.0)
MCV: 81.8 fL (ref 80.0–100.0)
PLATELETS: 207 10*3/uL (ref 150–440)
RBC: 4.41 MIL/uL (ref 3.80–5.20)
RDW: 14.1 % (ref 11.5–14.5)
WBC: 7.2 10*3/uL (ref 3.6–11.0)

## 2016-03-21 LAB — RAPID HIV SCREEN (HIV 1/2 AB+AG)
HIV 1/2 Antibodies: NONREACTIVE
HIV-1 P24 ANTIGEN - HIV24: NONREACTIVE

## 2016-03-21 LAB — LIPASE, BLOOD: LIPASE: 27 U/L (ref 11–51)

## 2016-03-21 LAB — PREGNANCY, URINE: Preg Test, Ur: NEGATIVE

## 2016-03-21 IMAGING — CT CT ABD-PELV W/ CM
2 of 4 series · 16 of 46 positions shown, 18 images · IV contrast (APPLIED)
Comparison: CT lumbar spine [DATE]

CLINICAL DATA: Lower abdominal pain beginning yesterday. Vomiting.
History of LEFT A2 oophorectomy.

EXAM:
CT ABDOMEN AND PELVIS WITH CONTRAST
TECHNIQUE: Multidetector CT imaging of the abdomen and pelvis was performed
using the standard protocol following bolus administration of
intravenous contrast.
CONTRAST:  100mL [DQ] IOPAMIDOL ([DQ]) INJECTION 61%

[Series 2: axial st · axial · 0.65mm/px · z∈[-432,-27]mm · 13 of 89 slices shown, 15 images]
[im 4/89  soft-tissue]
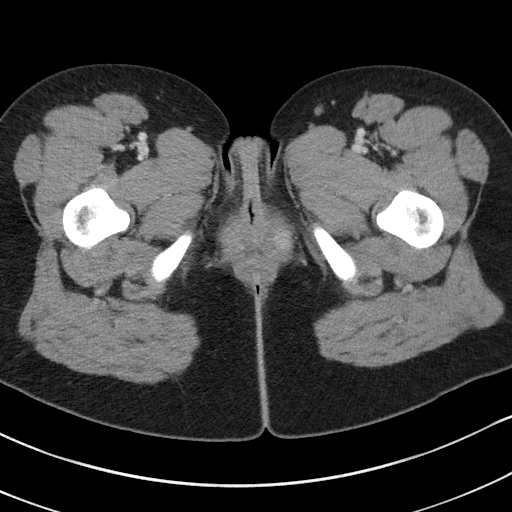
[im 4/89  bone]
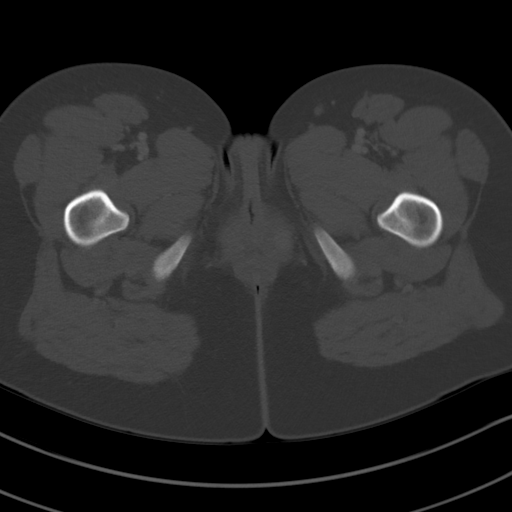
[im 11/89  soft-tissue]
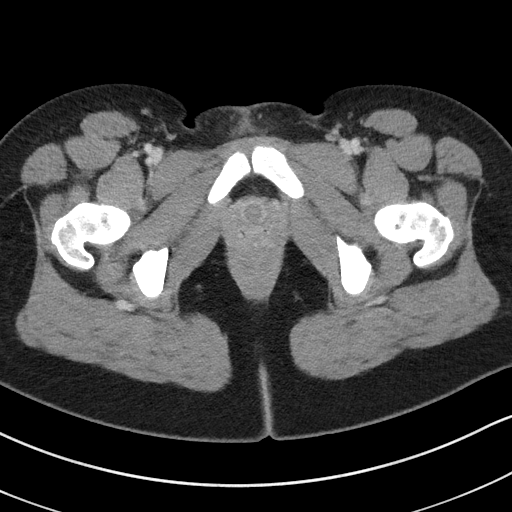
[im 18/89  soft-tissue]
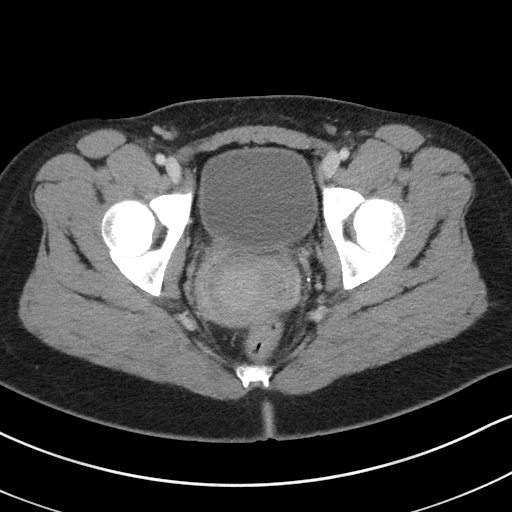
[im 25/89  soft-tissue]
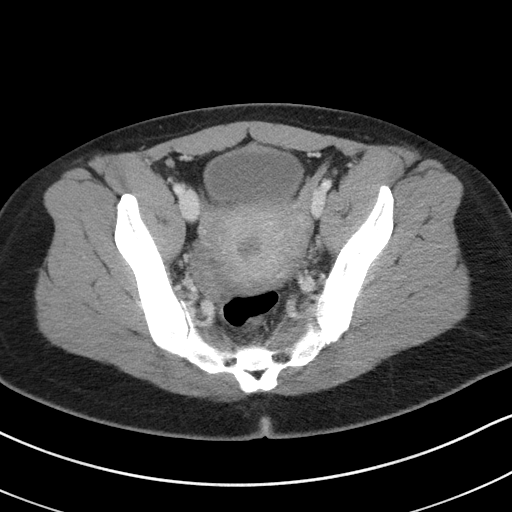
[im 32/89  soft-tissue]
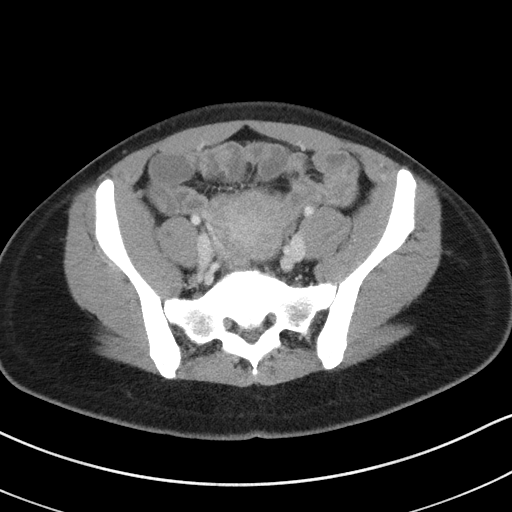
[im 39/89  soft-tissue]
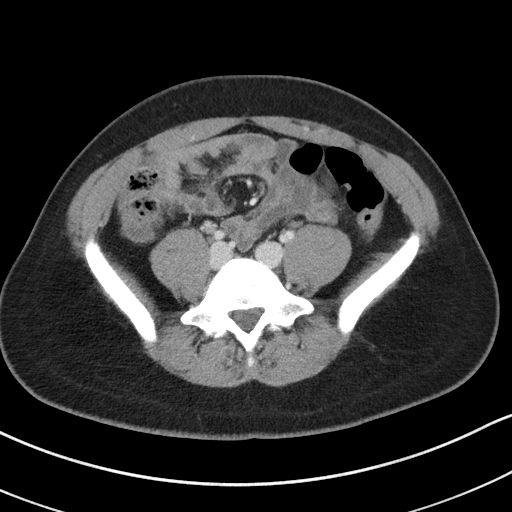
[im 46/89  soft-tissue]
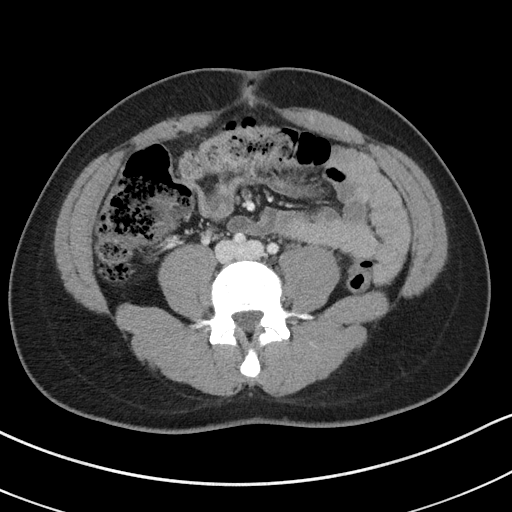
[im 50/89  soft-tissue]
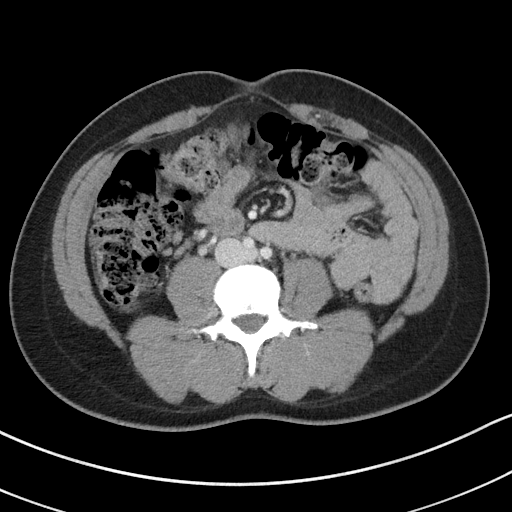
[im 57/89  soft-tissue]
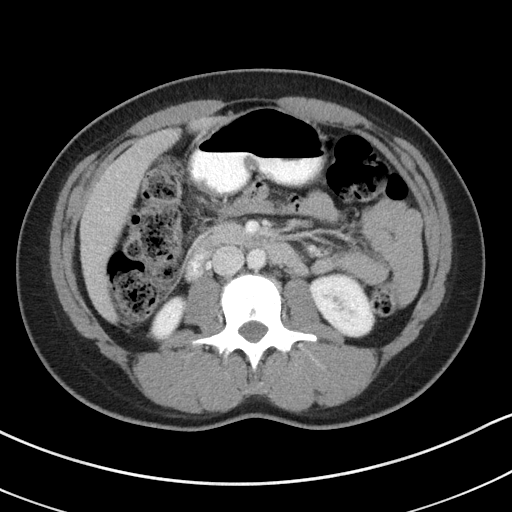
[im 57/89  bone]
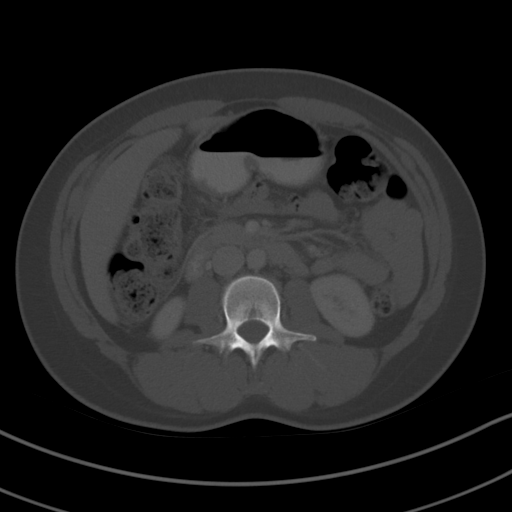
[im 64/89  soft-tissue]
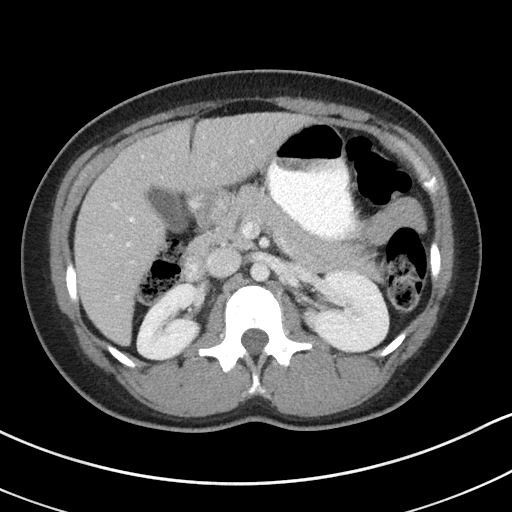
[im 71/89  soft-tissue]
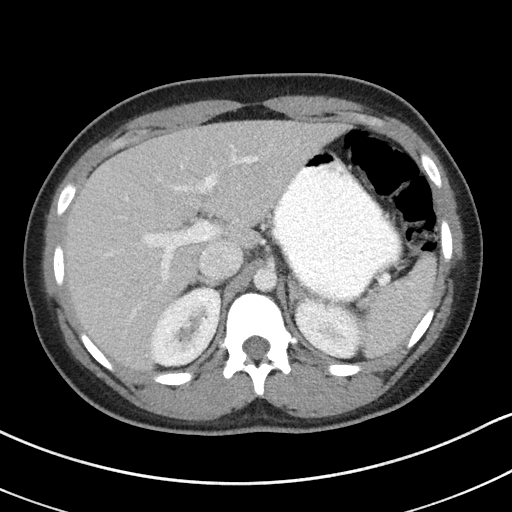
[im 78/89  soft-tissue]
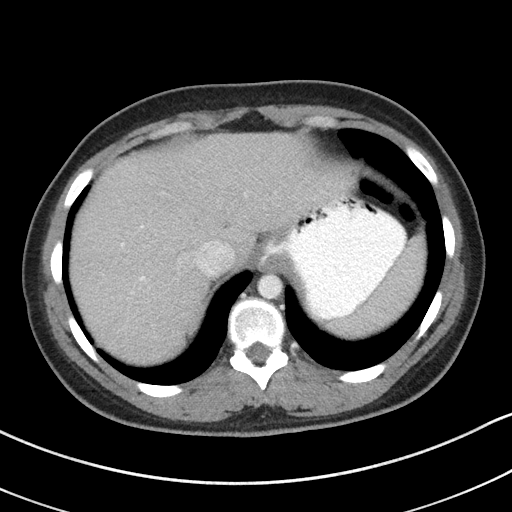
[im 85/89  soft-tissue]
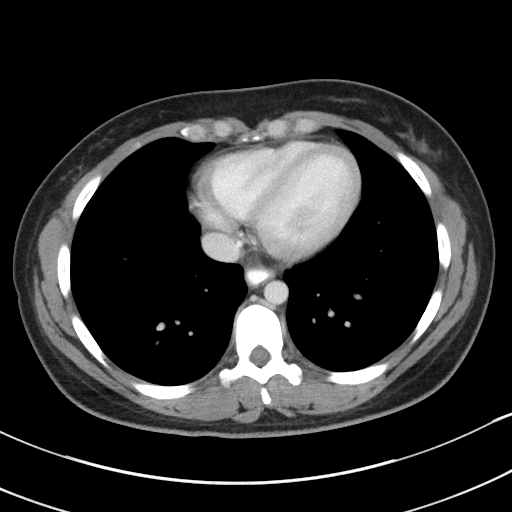

[Series 5: coronal st · coronal · 0.77mm/px · 3 of 78 slices shown]
[im 26/78  soft-tissue]
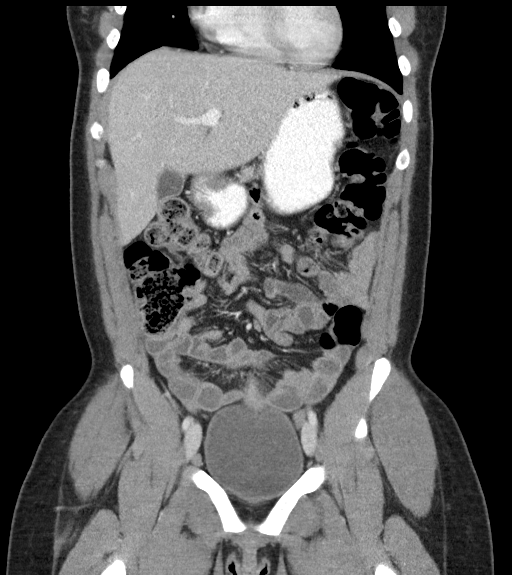
[im 35/78  soft-tissue]
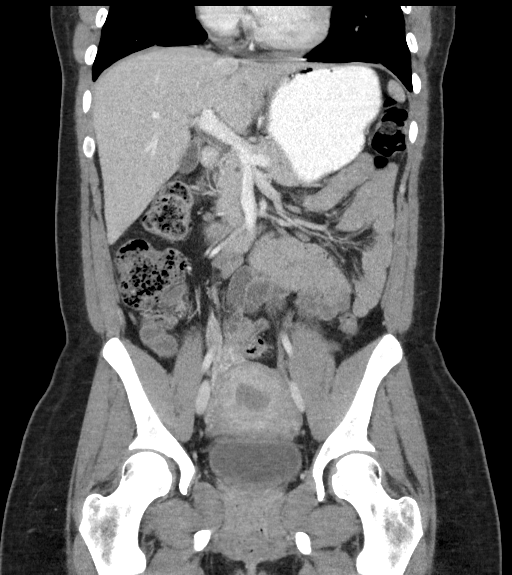
[im 43/78  soft-tissue]
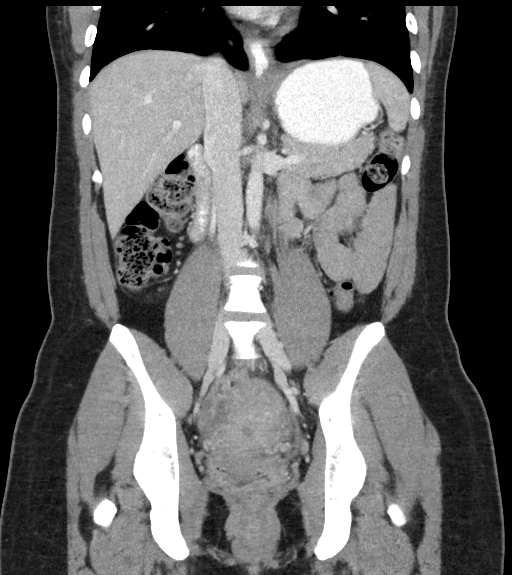

[16 of 46 positions shown; findings below may reference images not displayed]

FINDINGS: LOWER CHEST: Lung bases are clear. Included heart size is normal. No
pericardial effusion. Retain versus reflux contrast in the distal
esophagus.

HEPATOBILIARY: Liver and gallbladder are normal.

PANCREAS: Normal.

SPLEEN: Normal.

ADRENALS/URINARY TRACT: Kidneys are orthotopic, demonstrating
symmetric enhancement. No nephrolithiasis, hydronephrosis or solid
renal masses. The unopacified ureters are normal in course and
caliber. Urinary bladder is partially distended and unremarkable.
Normal retrocecal adrenal glands.

STOMACH/BOWEL: The stomach, small and large bowel are normal in
course and caliber without inflammatory changes. Normal appendix.

VASCULAR/LYMPHATIC: Aortoiliac vessels are normal in course and
caliber. No lymphadenopathy by CT size criteria. Subcentimeter
inguinal lymph nodes are likely reactive.

REPRODUCTIVE: Thickened cervix, fat stranding about the lower
uterine segment. Thickened broad ligaments.

OTHER: Small amount of free fluid in the pelvic cul-de-sac. No
drainable fluid collections. No intraperitoneal free air. Punctate
calcification within anterior uterus.

MUSCULOSKELETAL: Nonacute. Mild rectus abdominus diastases. Focal
periarticular sclerosis LEFT iliac bone most compatible with
osteitis condensans ilii. Scattered Schmorl's nodes.
IMPRESSION: CT findings of cervicitis/early pelvic inflammatory disease without
complication. Recommend correlation with gynecological examination.

## 2016-03-21 MED ORDER — CEFTRIAXONE SODIUM 250 MG IJ SOLR
250.0000 mg | Freq: Once | INTRAMUSCULAR | Status: AC
  Administered 2016-03-21: 250 mg via INTRAMUSCULAR

## 2016-03-21 MED ORDER — ONDANSETRON HCL 4 MG/2ML IJ SOLN
INTRAMUSCULAR | Status: AC
  Administered 2016-03-21: 4 mg via INTRAVENOUS
  Filled 2016-03-21: qty 2

## 2016-03-21 MED ORDER — ONDANSETRON HCL 4 MG/2ML IJ SOLN
4.0000 mg | INTRAMUSCULAR | Status: AC
  Administered 2016-03-21: 4 mg via INTRAVENOUS

## 2016-03-21 MED ORDER — IOPAMIDOL (ISOVUE-300) INJECTION 61%
100.0000 mL | Freq: Once | INTRAVENOUS | Status: AC | PRN
Start: 2016-03-21 — End: 2016-03-21
  Administered 2016-03-21: 100 mL via INTRAVENOUS
  Filled 2016-03-21: qty 100

## 2016-03-21 MED ORDER — MORPHINE SULFATE (PF) 4 MG/ML IV SOLN
INTRAVENOUS | Status: AC
  Administered 2016-03-21: 4 mg via INTRAVENOUS
  Filled 2016-03-21: qty 1

## 2016-03-21 MED ORDER — CEFTRIAXONE SODIUM 250 MG IJ SOLR
INTRAMUSCULAR | Status: AC
  Administered 2016-03-21: 250 mg via INTRAMUSCULAR
  Filled 2016-03-21: qty 250

## 2016-03-21 MED ORDER — METRONIDAZOLE 500 MG PO TABS
ORAL_TABLET | ORAL | Status: AC
  Administered 2016-03-21: 500 mg via ORAL
  Filled 2016-03-21: qty 1

## 2016-03-21 MED ORDER — IOPAMIDOL (ISOVUE-300) INJECTION 61%
30.0000 mL | Freq: Once | INTRAVENOUS | Status: AC | PRN
  Administered 2016-03-21: 30 mL via ORAL
  Filled 2016-03-21: qty 30

## 2016-03-21 MED ORDER — METRONIDAZOLE 500 MG PO TABS
500.0000 mg | ORAL_TABLET | Freq: Once | ORAL | Status: AC
  Administered 2016-03-21: 500 mg via ORAL

## 2016-03-21 MED ORDER — ONDANSETRON 4 MG PO TBDP: ORAL_TABLET | ORAL | 0 refills | Status: DC

## 2016-03-21 MED ORDER — DOXYCYCLINE HYCLATE 100 MG PO CAPS: ORAL_CAPSULE | ORAL | 0 refills | Status: DC

## 2016-03-21 MED ORDER — AZITHROMYCIN 250 MG PO TABS: 1000.0000 mg | ORAL_TABLET | ORAL | Status: DC

## 2016-03-21 MED ORDER — MORPHINE SULFATE (PF) 4 MG/ML IV SOLN
4.0000 mg | Freq: Once | INTRAVENOUS | Status: AC
  Administered 2016-03-21: 4 mg via INTRAVENOUS

## 2016-03-21 MED ORDER — DOXYCYCLINE HYCLATE 100 MG PO TABS
ORAL_TABLET | ORAL | Status: AC
  Administered 2016-03-21: 100 mg via ORAL
  Filled 2016-03-21: qty 1

## 2016-03-21 MED ORDER — METRONIDAZOLE 500 MG PO TABS: 500.0000 mg | ORAL_TABLET | Freq: Two times a day (BID) | ORAL | 0 refills | Status: DC

## 2016-03-21 MED ORDER — DOXYCYCLINE HYCLATE 100 MG PO TABS
100.0000 mg | ORAL_TABLET | Freq: Once | ORAL | Status: AC
  Administered 2016-03-21: 100 mg via ORAL

## 2016-03-21 NOTE — ED Notes (Signed)
Pt discharged to home.  Family member driving.  Discharge instructions reviewed.  Verbalized understanding.  No questions or concerns at this time.  Teach back verified.  Pt in NAD.  No items left in ED.   

## 2016-03-21 NOTE — ED Notes (Signed)
Spoke with Heidi Mata in lab regarding add-on test,  States order was received and began processing 14 minutes ago.

## 2016-03-21 NOTE — ED Provider Notes (Signed)
[facility] Emergency Department Provider Note  ____________________________________________   First MD Initiated Contact with Patient 03/21/16 2051     (approximate)  I have reviewed the triage vital signs and the nursing notes.   HISTORY  Chief Complaint Abdominal Pain    HPI Heidi Mata is a 30 y.o. female with no significant past medical history except a history of prior ectopic pregnancy status post laparoscopic surgery who presents for evaluation of gradual worsening right lower quadrant abdominal pain since yesterday.  She states that the pain started rather acutely after she got out of the shower and was present in her right side.  It has persisted and been accompanied with 2 or 3 episodes of vomiting.  She has had fairly persistent nausea.  The pain has increased from mild to moderate at this time.  She denies fever/chills, chest pain, shortness of breath.  She has no upper abdominal pain.  She has not had any vaginal bleeding recently and reports that her last menstrual period cycle was several months ago.She has not had any dysuria or increased urinary frequency.  She also denies diarrhea.   Past Medical History:  Diagnosis Date  . Hemosiderosis   . Migraines     There are no active problems to display for this patient.   Past Surgical History:  Procedure Laterality Date  . BREAST SURGERY     cyst removal, right  . CYST EXCISION    . ECTOPIC PREGNANCY SURGERY  2009  . LAPAROSCOPY FOR ECTOPIC PREGNANCY      Prior to Admission medications   Medication Sig Start Date End Date Taking? Authorizing Provider  acyclovir (ZOVIRAX) 800 MG tablet Take 1 tablet (800 mg total) by mouth 5 (five) times daily. 07/22/15   Earleen Newport, MD  doxycycline (VIBRAMYCIN) 100 MG capsule Take 1 capsule (100 mg) by mouth twice daily for 10 days. 03/21/16   Hinda Kehr, MD  HYDROcodone-acetaminophen (NORCO) 5-325 MG tablet Take 1 tablet by mouth every 6  (six) hours as needed for severe pain. 12/03/15   Mercedes Camprubi-Soms, PA-C  HYDROcodone-acetaminophen (NORCO/VICODIN) 5-325 MG tablet Take 1 tablet by mouth every 4 (four) hours as needed for moderate pain. 01/31/15   Johnn Hai, PA-C  ibuprofen (ADVIL,MOTRIN) 800 MG tablet Take 1 tablet (800 mg total) by mouth every 8 (eight) hours as needed for moderate pain. 12/10/15   Paulette Blanch, MD  metroNIDAZOLE (FLAGYL) 500 MG tablet Take 1 tablet (500 mg total) by mouth 2 (two) times daily. 03/21/16   Hinda Kehr, MD  naproxen (NAPROSYN) 500 MG tablet Take 1 tablet (500 mg total) by mouth 2 (two) times daily with a meal. 01/31/15   Johnn Hai, PA-C  naproxen (NAPROSYN) 500 MG tablet Take 1 tablet (500 mg total) by mouth 2 (two) times daily as needed for mild pain, moderate pain or headache (TAKE WITH MEALS.). 12/03/15   Mercedes Camprubi-Soms, PA-C  ondansetron (ZOFRAN ODT) 4 MG disintegrating tablet Allow 1-2 tablets to dissolve in your mouth every 8 hours as needed for nausea/vomiting 03/21/16   Hinda Kehr, MD  oxyCODONE-acetaminophen (ROXICET) 5-325 MG tablet Take 1 tablet by mouth every 4 (four) hours as needed for severe pain. 12/10/15   Paulette Blanch, MD  predniSONE (STERAPRED UNI-PAK 21 TAB) 10 MG (21) TBPK tablet Take 6 tablets on day 1 Take 5 tablets on day 2 Take 4 tablets on day 3 Take 3 tablets on day 4 Take 2 tablets on  day 5 Take 1 tablet on day 6 Patient not taking: Reported on 08/30/2014 08/24/14   Victorino Dike, FNP  predniSONE (STERAPRED UNI-PAK 21 TAB) 10 MG (21) TBPK tablet Take steroid taper pak as directed 07/22/15   Earleen Newport, MD  PRESCRIPTION MEDICATION as needed (for migraines).    Historical Provider, MD  tiZANidine (ZANAFLEX) 4 MG capsule Take 1 capsule (4 mg total) by mouth 3 (three) times daily as needed (headaches). 12/10/14   Jennifer Piepenbrink, PA-C  trimethoprim-polymyxin b (POLYTRIM) ophthalmic solution Place 1 drop into the left eye every 4 (four)  hours. X 5 days 12/10/14   Baron Sane, PA-C  Vitamins A & D (RA VITAMIN A & D) OINT Apply 1 application topically 3 (three) times daily. Patient not taking: Reported on 08/30/2014 08/24/14   Victorino Dike, FNP    Allergies Lactose intolerance (gi) and Sulfa antibiotics  History reviewed. No pertinent family history.  Social History Social History  Substance Use Topics  . Smoking status: Never Smoker  . Smokeless tobacco: Never Used  . Alcohol use No    Review of Systems Constitutional: No fever/chills Eyes: No visual changes. ENT: No sore throat. Cardiovascular: Denies chest pain. Respiratory: Denies shortness of breath. Gastrointestinal: RLQ abdominal pain.  +N/V.  No diarrhea.  No constipation. Genitourinary: Negative for dysuria.  No Vaginal bleeding. Musculoskeletal: Negative for back pain. Skin: Negative for rash. Neurological: Negative for headaches, focal weakness or numbness.  10-point ROS otherwise negative.  ____________________________________________   PHYSICAL EXAM:  VITAL SIGNS: ED Triage Vitals  Enc Vitals Group     BP 03/21/16 1816 119/75     Pulse Rate 03/21/16 1816 84     Resp 03/21/16 1816 18     Temp 03/21/16 1816 98.2 F (36.8 C)     Temp Source 03/21/16 1816 Oral     SpO2 03/21/16 1816 98 %     Weight 03/21/16 1816 160 lb (72.6 kg)     Height 03/21/16 1816 5\' 2"  (1.575 m)     Head Circumference --      Peak Flow --      Pain Score 03/21/16 1817 7     Pain Loc --      Pain Edu? --      Excl. in [place]? --     Constitutional: Alert and oriented. Well appearing and in no acute distress But does appear to be uncomfortable. Eyes: Conjunctivae are normal. PERRL. EOMI. Head: Atraumatic. Nose: No congestion/rhinnorhea. Mouth/Throat: Mucous membranes are moist.  Oropharynx non-erythematous. Neck: No stridor.  No meningeal signs.   Cardiovascular: Normal rate, regular rhythm. Good peripheral circulation. Grossly normal heart  sounds. Respiratory: Normal respiratory effort.  No retractions. Lungs CTAB. Gastrointestinal: Soft but with some mild distention of her lower abdomen, guarding with palpation, and significant tenderness to palpation of the right lower quadrant.  No right upper quadrant tenderness to palpation with negative Murphy sign.  No rebound tenderness. GU:  Normal external exam.  Copious whitish discharge in vaginal vault.  Painful speculum exam.  Normal appearing cervix.  Significant CMT on digital exam.  No adnexal tenderness.  Female ED tech chaperone present throughout exam. Musculoskeletal: No lower extremity tenderness nor edema. No gross deformities of extremities. Neurologic:  Normal speech and language. No gross focal neurologic deficits are appreciated.  Skin:  Skin is warm, dry and intact. No rash noted. Psychiatric: Mood and affect are normal. Speech and behavior are normal.  ____________________________________________   LABS (all labs  ordered are listed, but only abnormal results are displayed)  Labs Reviewed  COMPREHENSIVE METABOLIC PANEL - Abnormal; Notable for the following:       Result Value   Potassium 3.4 (*)    Creatinine, Ser 1.01 (*)    Total Protein 8.4 (*)    All other components within normal limits  URINALYSIS COMPLETEWITH MICROSCOPIC ([facility] ONLY) - Abnormal; Notable for the following:    Color, Urine YELLOW (*)    APPearance CLEAR (*)    Squamous Epithelial / LPF 6-30 (*)    All other components within normal limits  WET PREP, GENITAL  CHLAMYDIA/NGC RT PCR ([facility] ONLY)  LIPASE, BLOOD  CBC  PREGNANCY, URINE  RAPID HIV SCREEN (HIV 1/2 AB+AG)   ____________________________________________  EKG  None - EKG not ordered by ED physician ____________________________________________  RADIOLOGY   Ct Abdomen Pelvis W Contrast  Result Date: 03/21/2016 CLINICAL DATA:  Lower abdominal pain beginning yesterday. Vomiting. History of LEFT A2 oophorectomy.  EXAM: CT ABDOMEN AND PELVIS WITH CONTRAST TECHNIQUE: Multidetector CT imaging of the abdomen and pelvis was performed using the standard protocol following bolus administration of intravenous contrast. CONTRAST:  156mL ISOVUE-300 IOPAMIDOL (ISOVUE-300) INJECTION 61% COMPARISON:  CT lumbar spine May 23, 2007 FINDINGS: LOWER CHEST: Lung bases are clear. Included heart size is normal. No pericardial effusion. Retain versus reflux contrast in the distal esophagus. HEPATOBILIARY: Liver and gallbladder are normal. PANCREAS: Normal. SPLEEN: Normal. ADRENALS/URINARY TRACT: Kidneys are orthotopic, demonstrating symmetric enhancement. No nephrolithiasis, hydronephrosis or solid renal masses. The unopacified ureters are normal in course and caliber. Urinary bladder is partially distended and unremarkable. Normal retrocecal adrenal glands. STOMACH/BOWEL: The stomach, small and large bowel are normal in course and caliber without inflammatory changes. Normal appendix. VASCULAR/LYMPHATIC: Aortoiliac vessels are normal in course and caliber. No lymphadenopathy by CT size criteria. Subcentimeter inguinal lymph nodes are likely reactive. REPRODUCTIVE: Thickened cervix, fat stranding about the lower uterine segment. Thickened broad ligaments. OTHER: Small amount of free fluid in the pelvic cul-de-sac. No drainable fluid collections. No intraperitoneal free air. Punctate calcification within anterior uterus. MUSCULOSKELETAL: Nonacute. Mild rectus abdominus diastases. 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 Scattered Schmorl's nodes. IMPRESSION: CT findings of cervicitis/early pelvic inflammatory disease without complication. Recommend correlation with gynecological examination. Electronically Signed   By: Elon Alas M.D.   On: 03/21/2016 22:51    ____________________________________________   PROCEDURES  Procedure(s) performed:   Procedures   Critical Care  performed: No ____________________________________________   INITIAL IMPRESSION / ASSESSMENT AND PLAN / ED COURSE  Pertinent labs & imaging results that were available during my care of the patient were reviewed by me and considered in my medical decision making (see chart for details).  The patient's vital signs and lab workup were all reassuring.  However, her physical exam is somewhat concerning with significant tenderness to palpation of the right lower quadrant with guarding and some mild distention.  She has had surgery in the past for ectopic pregnancy.  Ileus/SBO is possible although unlikely.  Appendicitis is possible although somewhat less likely given no leukocytosis and normal vital signs although it is certainly not ruled out.  Adnexal pathology is also possible but torsion is very unlikely based on the relatively mild pain and the persistence and gradually worsening over the last 24 hours.  I think the patient would be best suited for a CT scan of the abdomen and pelvis with oral and IV contrast.  I discussed this with her  and she understands and agrees with the plan.   ----------------------------------------- 11:27 PM on 03/21/2016 -----------------------------------------  The patient's CT scan is concerning for cervicitis/PID.  Again she is nontoxic appearing with normal vitals and no leukocytosis.  I had a private conversation with her without her partner present and we discussed the implications of this diagnosis.  We performed a pelvic exam and she does have cervical motion tenderness and copious vaginal discharge.  Unfortunately she has to go now due to child care reasons.  I am treating her empirically with Rocephin 250 mg intramuscular, doxycycline 100 mg by mouth twice a day 14 days, and metronidazole 500 mg by mouth twice a day 14 days.  I gave her first dose of doxycycline and metronidazole in the emergency department along with Rocephin.  I sent a message through [facility] to  live scan and asked her to please call the patient tomorrow with the results (I also added on rapid HIV screening).  I gave my usual and customary management advice and return precautions for PID.  ____________________________________________  FINAL CLINICAL IMPRESSION(S) / ED DIAGNOSES  Final diagnoses:  PID (acute pelvic inflammatory disease)     MEDICATIONS GIVEN DURING THIS VISIT:  Medications  morphine 4 MG/ML injection 4 mg (4 mg Intravenous Given 03/21/16 2109)  ondansetron (ZOFRAN) injection 4 mg (4 mg Intravenous Given 03/21/16 2108)  iopamidol (ISOVUE-300) 61 % injection 30 mL (30 mLs Oral Contrast Given 03/21/16 2108)  iopamidol (ISOVUE-300) 61 % injection 100 mL (100 mLs Intravenous Contrast Given 03/21/16 2158)  cefTRIAXone (ROCEPHIN) injection 250 mg (250 mg Intramuscular Given 03/21/16 2311)  doxycycline (VIBRA-TABS) tablet 100 mg (100 mg Oral Given 03/21/16 2311)  metroNIDAZOLE (FLAGYL) tablet 500 mg (500 mg Oral Given 03/21/16 2312)     NEW OUTPATIENT MEDICATIONS STARTED DURING THIS VISIT:  New Prescriptions   DOXYCYCLINE (VIBRAMYCIN) 100 MG CAPSULE    Take 1 capsule (100 mg) by mouth twice daily for 10 days.   METRONIDAZOLE (FLAGYL) 500 MG TABLET    Take 1 tablet (500 mg total) by mouth 2 (two) times daily.   ONDANSETRON (ZOFRAN ODT) 4 MG DISINTEGRATING TABLET    Allow 1-2 tablets to dissolve in your mouth every 8 hours as needed for nausea/vomiting    Modified Medications   No medications on file    Discontinued Medications   DOXYCYCLINE (VIBRAMYCIN) 50 MG CAPSULE    Take 2 capsules (100 mg total) by mouth 2 (two) times daily.     Note:  This document was prepared using Dragon voice recognition software and may include unintentional dictation errors.    Hinda Kehr, MD 03/21/16 2329

## 2016-03-21 NOTE — Discharge Instructions (Signed)
As we discussed, you appear to have an infection of your reproductive organs.  This condition is called Pelvic Inflammatory Disease (PID).  Most of the time this kind of infection is caused by a sexually transmitted disease, such as gonorrhea or chlamydia, but it is also possible for "normal" bacteria to cause this kind of infection.  We took swabs tonight but the results will not be back until later.  If your swabs are positive for either gonorrhea or chlamydia, you will be contacted by an ED nurse to let you know.  In general, they do not call with negative results, but we will try to do so in your case.  Please take the full course of treatment as prescribed regardless of the results of the test because you do have some type of infection that needs to be treated.  We recommend he follow up with your primary care doctor at the next available opportunity.  Alternatively, you can follow-up with an OB/GYN specialist such as Dr. Leonides Schanz at the address and phone number provided.  Remember that if your infection is due to a sexually transmitted disease, although we are currently treating your condition, you can still acquire the disease from partners who have the same infection.  Return to the emergency department if you develop new or worsening symptoms that concern you.

## 2016-03-21 NOTE — ED Triage Notes (Signed)
Pt c/o pain to lower abdomen since yesterday. Last normal period was in September. No period in October and short period in November.  No known fevers. Has had vomiting. Unsure if pregnant.

## 2016-03-22 ENCOUNTER — Telehealth: Payer: Self-pay | Admitting: Emergency Medicine

## 2016-03-22 LAB — CHLAMYDIA/NGC RT PCR (ARMC ONLY)
Chlamydia Tr: NOT DETECTED
N gonorrhoeae: NOT DETECTED

## 2016-03-22 NOTE — Telephone Encounter (Signed)
Called patient to give results to hiv, gc and chlamydia tests that were not complete at discharge.  Answered question about how she can get her records for her pcp.

## 2016-03-29 ENCOUNTER — Telehealth: Payer: Self-pay | Admitting: Emergency Medicine

## 2016-03-29 NOTE — Telephone Encounter (Signed)
Patient called and says she never took her medications or filled them as she lost the papers.  She does continue to have abdominal pain and some nausea--no fever and does not feel sick all over.  I explained how important it was for her to take the remaining antibiotics as she was diagnosed with PID.   I encouraged her to follow up with her pcp within next couple days or return here and she agrees to do that.  I called her meds to [facility] [place].

## 2016-08-05 ENCOUNTER — Telehealth: Payer: Self-pay | Admitting: Obstetrics & Gynecology

## 2016-08-05 NOTE — Telephone Encounter (Signed)
PT is being Referred by [facility]. For Dub and period on 2X this month, Excessive Bleeding h/o Recurrent BV and CT 03/21/16 [facility] Chronic Cervicitis ,H/O hsv needs pap. Pt is new to practice at time time there is a temporary hold on New medicaid Pt.

## 2016-08-07 ENCOUNTER — Emergency Department
Admission: EM | Admit: 2016-08-07 | Discharge: 2016-08-07 | Disposition: A | Discharge status: Home / Self Care | Payer: Medicaid Other | Attending: Emergency Medicine | Admitting: Emergency Medicine

## 2016-08-07 ENCOUNTER — Encounter: Payer: Self-pay | Admitting: Medical Oncology

## 2016-08-07 DIAGNOSIS — Y999 Unspecified external cause status: Secondary | ICD-10-CM | POA: Insufficient documentation

## 2016-08-07 DIAGNOSIS — M542 Cervicalgia: Secondary | ICD-10-CM | POA: Insufficient documentation

## 2016-08-07 DIAGNOSIS — Y92410 Unspecified street and highway as the place of occurrence of the external cause: Secondary | ICD-10-CM | POA: Diagnosis not present

## 2016-08-07 DIAGNOSIS — M25561 Pain in right knee: Secondary | ICD-10-CM | POA: Insufficient documentation

## 2016-08-07 DIAGNOSIS — M546 Pain in thoracic spine: Secondary | ICD-10-CM | POA: Diagnosis not present

## 2016-08-07 DIAGNOSIS — S299XXA Unspecified injury of thorax, initial encounter: Secondary | ICD-10-CM | POA: Diagnosis present

## 2016-08-07 DIAGNOSIS — M7918 Myalgia, other site: Secondary | ICD-10-CM

## 2016-08-07 DIAGNOSIS — Y939 Activity, unspecified: Secondary | ICD-10-CM | POA: Diagnosis not present

## 2016-08-07 MED ORDER — TIZANIDINE HCL 4 MG PO TABS: 4.0000 mg | ORAL_TABLET | Freq: Three times a day (TID) | ORAL | 0 refills | Status: DC

## 2016-08-07 MED ORDER — HYDROCODONE-ACETAMINOPHEN 5-325 MG PO TABS
1.0000 | ORAL_TABLET | Freq: Once | ORAL | Status: AC
Start: 2016-08-07 — End: 2016-08-07
  Administered 2016-08-07: 1 via ORAL
  Filled 2016-08-07: qty 1

## 2016-08-07 MED ORDER — NAPROXEN 500 MG PO TABS: 500.0000 mg | ORAL_TABLET | Freq: Two times a day (BID) | ORAL | 0 refills | Status: DC

## 2016-08-07 MED ORDER — ORPHENADRINE CITRATE 30 MG/ML IJ SOLN
60.0000 mg | Freq: Two times a day (BID) | INTRAMUSCULAR | Status: DC
  Administered 2016-08-07: 60 mg via INTRAMUSCULAR
  Filled 2016-08-07: qty 2

## 2016-08-07 MED ORDER — CYCLOBENZAPRINE HCL 10 MG PO TABS: 10.0000 mg | ORAL_TABLET | Freq: Three times a day (TID) | ORAL | 0 refills | Status: DC | PRN
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
## 2016-08-11 ENCOUNTER — Encounter: Payer: Self-pay | Admitting: Obstetrics and Gynecology

## 2016-08-31 ENCOUNTER — Telehealth: Payer: Self-pay | Admitting: Emergency Medicine

## 2016-08-31 ENCOUNTER — Encounter: Payer: Self-pay | Admitting: Emergency Medicine

## 2016-08-31 ENCOUNTER — Emergency Department: Payer: Medicaid Other

## 2016-08-31 ENCOUNTER — Emergency Department
Admission: EM | Admit: 2016-08-31 | Discharge: 2016-08-31 | Disposition: A | Discharge status: Home / Self Care | Payer: Medicaid Other | Attending: Emergency Medicine | Admitting: Emergency Medicine

## 2016-08-31 DIAGNOSIS — N939 Abnormal uterine and vaginal bleeding, unspecified: Secondary | ICD-10-CM | POA: Insufficient documentation

## 2016-08-31 DIAGNOSIS — Z5321 Procedure and treatment not carried out due to patient leaving prior to being seen by health care provider: Secondary | ICD-10-CM | POA: Insufficient documentation

## 2016-08-31 DIAGNOSIS — R109 Unspecified abdominal pain: Secondary | ICD-10-CM | POA: Diagnosis not present

## 2016-08-31 LAB — COMPREHENSIVE METABOLIC PANEL
ALBUMIN: 4.5 g/dL (ref 3.5–5.0)
ALT: 11 U/L — AB (ref 14–54)
AST: 22 U/L (ref 15–41)
Alkaline Phosphatase: 40 U/L (ref 38–126)
Anion gap: 7 (ref 5–15)
BUN: 11 mg/dL (ref 6–20)
CHLORIDE: 105 mmol/L (ref 101–111)
CO2: 24 mmol/L (ref 22–32)
CREATININE: 0.9 mg/dL (ref 0.44–1.00)
Calcium: 9.4 mg/dL (ref 8.9–10.3)
GFR calc non Af Amer: >60 mL/min (ref >60)
GLUCOSE: 96 mg/dL (ref 65–99)
Potassium: 3.5 mmol/L (ref 3.5–5.1)
SODIUM: 136 mmol/L (ref 135–145)
Total Bilirubin: 1.2 mg/dL (ref 0.3–1.2)
Total Protein: 7.8 g/dL (ref 6.5–8.1)

## 2016-08-31 LAB — URINALYSIS, COMPLETE (UACMP) WITH MICROSCOPIC
Bilirubin Urine: NEGATIVE
Glucose, UA: NEGATIVE mg/dL
Hgb urine dipstick: NEGATIVE
KETONES UR: NEGATIVE mg/dL
Leukocytes, UA: NEGATIVE
Nitrite: NEGATIVE
PH: 6 (ref 5.0–8.0)
PROTEIN: NEGATIVE mg/dL
Specific Gravity, Urine: 1.015 (ref 1.005–1.030)

## 2016-08-31 LAB — LIPASE, BLOOD: Lipase: 21 U/L (ref 11–51)

## 2016-08-31 LAB — ABO/RH: ABO/RH(D): O POS

## 2016-08-31 LAB — CBC
HCT: 34.6 % — ABNORMAL LOW (ref 35.0–47.0)
Hemoglobin: 11.3 g/dL — ABNORMAL LOW (ref 12.0–16.0)
MCH: 26 pg (ref 26.0–34.0)
MCHC: 32.6 g/dL (ref 32.0–36.0)
MCV: 79.7 fL — AB (ref 80.0–100.0)
PLATELETS: 196 10*3/uL (ref 150–440)
RBC: 4.34 MIL/uL (ref 3.80–5.20)
RDW: 15.4 % — ABNORMAL HIGH (ref 11.5–14.5)
WBC: 6 10*3/uL (ref 3.6–11.0)

## 2016-08-31 LAB — HCG, QUANTITATIVE, PREGNANCY: hCG, Beta Chain, Quant, S: 71 m[IU]/mL — ABNORMAL HIGH (ref <5)

## 2016-08-31 MED ORDER — ACETAMINOPHEN 500 MG PO TABS
1000.0000 mg | ORAL_TABLET | Freq: Once | ORAL | Status: DC
  Filled 2016-08-31: qty 2

## 2016-08-31 NOTE — ED Notes (Signed)
Pt crying stating she is hurting and wants to go home. Pt states "ya'll are just waiting for shift change I'm in pain."

## 2016-08-31 NOTE — ED Triage Notes (Signed)
Pt presents to ED c/o abdominal cramping and vaginal bleeding beginning about 1 hr PTA. Pt believes she is about [redacted] weeks pregnant.

## 2016-08-31 NOTE — Telephone Encounter (Signed)
Called patient due to lwot to inquire about condition and follow up plans. Left message.   

## 2016-10-18 ENCOUNTER — Encounter: Payer: Self-pay | Admitting: Emergency Medicine

## 2016-10-18 ENCOUNTER — Emergency Department: Payer: Medicaid Other

## 2016-10-18 ENCOUNTER — Emergency Department
Admission: EM | Admit: 2016-10-18 | Discharge: 2016-10-18 | Disposition: A | Discharge status: Home / Self Care | Payer: Medicaid Other | Attending: Emergency Medicine | Admitting: Emergency Medicine

## 2016-10-18 DIAGNOSIS — O200 Threatened abortion: Secondary | ICD-10-CM | POA: Diagnosis not present

## 2016-10-18 DIAGNOSIS — Z3A11 11 weeks gestation of pregnancy: Secondary | ICD-10-CM | POA: Insufficient documentation

## 2016-10-18 DIAGNOSIS — O209 Hemorrhage in early pregnancy, unspecified: Secondary | ICD-10-CM | POA: Diagnosis present

## 2016-10-18 DIAGNOSIS — N939 Abnormal uterine and vaginal bleeding, unspecified: Secondary | ICD-10-CM

## 2016-10-18 LAB — CBC WITH DIFFERENTIAL/PLATELET
BASOS PCT: 1 %
Basophils Absolute: 0 10*3/uL (ref 0–0.1)
EOS PCT: 1 %
Eosinophils Absolute: 0.1 10*3/uL (ref 0–0.7)
HEMATOCRIT: 34.8 % — AB (ref 35.0–47.0)
Hemoglobin: 11.6 g/dL — ABNORMAL LOW (ref 12.0–16.0)
Lymphocytes Relative: 27 %
Lymphs Abs: 1.9 10*3/uL (ref 1.0–3.6)
MCH: 27 pg (ref 26.0–34.0)
MCHC: 33.3 g/dL (ref 32.0–36.0)
MCV: 81.1 fL (ref 80.0–100.0)
MONO ABS: 0.5 10*3/uL (ref 0.2–0.9)
MONOS PCT: 8 %
NEUTROS ABS: 4.6 10*3/uL (ref 1.4–6.5)
Neutrophils Relative %: 63 %
Platelets: 215 10*3/uL (ref 150–440)
RBC: 4.29 MIL/uL (ref 3.80–5.20)
RDW: 15.5 % — AB (ref 11.5–14.5)
WBC: 7.2 10*3/uL (ref 3.6–11.0)

## 2016-10-18 LAB — COMPREHENSIVE METABOLIC PANEL
ALBUMIN: 4.3 g/dL (ref 3.5–5.0)
ALT: 14 U/L (ref 14–54)
ANION GAP: 10 (ref 5–15)
AST: 29 U/L (ref 15–41)
Alkaline Phosphatase: 44 U/L (ref 38–126)
BILIRUBIN TOTAL: 1.1 mg/dL (ref 0.3–1.2)
BUN: 7 mg/dL (ref 6–20)
CO2: 23 mmol/L (ref 22–32)
Calcium: 9.9 mg/dL (ref 8.9–10.3)
Chloride: 103 mmol/L (ref 101–111)
Creatinine, Ser: 0.7 mg/dL (ref 0.44–1.00)
GFR calc Af Amer: >60 mL/min (ref >60)
GFR calc non Af Amer: >60 mL/min (ref >60)
GLUCOSE: 81 mg/dL (ref 65–99)
POTASSIUM: 3.3 mmol/L — AB (ref 3.5–5.1)
Sodium: 136 mmol/L (ref 135–145)
TOTAL PROTEIN: 8.2 g/dL — AB (ref 6.5–8.1)

## 2016-10-18 LAB — ABO/RH: ABO/RH(D): O POS

## 2016-10-18 LAB — HCG, QUANTITATIVE, PREGNANCY: hCG, Beta Chain, Quant, S: 74319 m[IU]/mL — ABNORMAL HIGH (ref <5)

## 2016-10-18 MED ORDER — ACETAMINOPHEN 325 MG PO TABS
650.0000 mg | ORAL_TABLET | Freq: Once | ORAL | Status: AC
  Administered 2016-10-18: 650 mg via ORAL
  Filled 2016-10-18: qty 2

## 2016-10-18 MED ORDER — ONDANSETRON 4 MG PO TBDP: 4.0000 mg | ORAL_TABLET | Freq: Three times a day (TID) | ORAL | 0 refills | Status: DC | PRN

## 2016-10-18 NOTE — ED Provider Notes (Signed)
[facility] Emergency Department Provider Note       Time seen: ----------------------------------------- 2:20 PM on 10/18/2016 -----------------------------------------     I have reviewed the triage vital signs and the nursing notes.   HISTORY   Chief Complaint Vaginal Bleeding    HPI Heidi Mata is a 31 y.o. female who presents to the ED for vaginal bleeding and pregnancy. Patient states she is a weeks pregnant and started bleeding this morning. She said vaginal bleeding and lower abdominal cramping. Onset was around 10 AM, nothing makes it better or worse.   Past Medical History:  Diagnosis Date  . Hemosiderosis   . Migraines     There are no active problems to display for this patient.   Past Surgical History:  Procedure Laterality Date  . BREAST SURGERY     cyst removal, right  . CYST EXCISION    . ECTOPIC PREGNANCY SURGERY  2009  . LAPAROSCOPY FOR ECTOPIC PREGNANCY      Allergies Lactose intolerance (gi) and Sulfa antibiotics  Social History Social History  Substance Use Topics  . Smoking status: Never Smoker  . Smokeless tobacco: Never Used  . Alcohol use No    Review of Systems Constitutional: Negative for fever. Eyes: Negative for vision changes ENT:  Negative for congestion, sore throat Cardiovascular: Negative for chest pain. Respiratory: Negative for shortness of breath. Gastrointestinal: Positive for abdominal pain Genitourinary: Positive for vaginal bleeding Musculoskeletal: Negative for back pain. Skin: Negative for rash. Neurological: Negative for headaches, focal weakness or numbness.  All systems negative/normal/unremarkable except as stated in the HPI  ____________________________________________   PHYSICAL EXAM:  VITAL SIGNS: ED Triage Vitals  Enc Vitals Group     BP 10/18/16 1143 124/73     Pulse Rate 10/18/16 1143 73     Resp 10/18/16 1143 18     Temp 10/18/16 1143 98 F (36.7 C)      Temp Source 10/18/16 1143 Oral     SpO2 10/18/16 1143 100 %     Weight 10/18/16 1133 140 lb (63.5 kg)     Height 10/18/16 1133 5\' 2"  (1.575 m)     Head Circumference --      Peak Flow --      Pain Score 10/18/16 1133 8     Pain Loc --      Pain Edu? --      Excl. in [place]? --     Constitutional: Alert and oriented. Well appearing and in no distress. Eyes: Conjunctivae are normal. Normal extraocular movements. ENT   Head: Normocephalic and atraumatic.   Nose: No congestion/rhinnorhea.   Mouth/Throat: Mucous membranes are moist.   Neck: No stridor. Cardiovascular: Normal rate, regular rhythm. No murmurs, rubs, or gallops. Respiratory: Normal respiratory effort without tachypnea nor retractions. Breath sounds are clear and equal bilaterally. No wheezes/rales/rhonchi. Gastrointestinal: Soft and nontender. Normal bowel sounds Musculoskeletal: Nontender with normal range of motion in extremities. No lower extremity tenderness nor edema. Neurologic:  Normal speech and language. No gross focal neurologic deficits are appreciated.  Skin:  Skin is warm, dry and intact. No rash noted. Psychiatric: Mood and affect are normal. Speech and behavior are normal.  ____________________________________________  ED COURSE:  Pertinent labs & imaging results that were available during my care of the patient were reviewed by me and considered in my medical decision making (see chart for details). Patient presents for vaginal bleeding and pregnancy, we will assess with labs and imaging as indicated.  Procedures ____________________________________________   LABS (pertinent positives/negatives)  Labs Reviewed  HCG, QUANTITATIVE, PREGNANCY - Abnormal; Notable for the following:       Result Value   hCG, Beta Chain, Quant, S 74,319 (*)    All other components within normal limits  CBC WITH DIFFERENTIAL/PLATELET - Abnormal; Notable for the following:    Hemoglobin 11.6 (*)    HCT 34.8 (*)     RDW 15.5 (*)    All other components within normal limits  COMPREHENSIVE METABOLIC PANEL - Abnormal; Notable for the following:    Potassium 3.3 (*)    Total Protein 8.2 (*)    All other components within normal limits  ABO/RH    RADIOLOGY  Pregnancy ultrasound IMPRESSION: Single live intrauterine pregnancy corresponding to 11 weeks and 1 day gestation.  Moderate in size subchorionic hemorrhage.  Two subserosal fibroids within the anterior uterine body. ____________________________________________  FINAL ASSESSMENT AND PLAN  Threatened miscarriage  Plan: Patient's labs and imaging were dictated above. Patient had presented for Vaginal bleeding in pregnancy and does have a subchorionic hemorrhage. Otherwise unremarkable ultrasound with viable embryo. She'll be referred to outpatient follow-up with GYN.   Earleen Newport, MD   Note: This note was generated in part or whole with voice recognition software. Voice recognition is usually quite accurate but there are transcription errors that can and very often do occur. I apologize for any typographical errors that were not detected and corrected.     Earleen Newport, MD 10/18/16 [phone]

## 2016-10-18 NOTE — ED Triage Notes (Signed)
Patient states she is [redacted] weeks pregnant and started vaginal bleeding this morning.  C/O vaginal bleeding and lower abdominal cramping.  Noticed bleeding this morning at 1000.

## 2016-11-17 ENCOUNTER — Encounter: Payer: Self-pay | Admitting: Obstetrics and Gynecology

## 2016-11-24 LAB — OB RESULTS CONSOLE RPR: RPR: NONREACTIVE

## 2016-11-24 LAB — HM PAP SMEAR

## 2016-11-24 LAB — OB RESULTS CONSOLE RUBELLA ANTIBODY, IGM: Rubella: IMMUNE

## 2016-11-24 LAB — OB RESULTS CONSOLE HIV ANTIBODY (ROUTINE TESTING): HIV: NONREACTIVE

## 2016-11-24 LAB — OB RESULTS CONSOLE VARICELLA ZOSTER ANTIBODY, IGG: Varicella: IMMUNE

## 2016-11-24 LAB — OB RESULTS CONSOLE HEPATITIS B SURFACE ANTIGEN: Hepatitis B Surface Ag: NEGATIVE

## 2017-02-06 ENCOUNTER — Observation Stay
Admission: EM | Admit: 2017-02-06 | Discharge: 2017-02-06 | Disposition: A | Discharge status: Home / Self Care | Payer: Medicaid Other | Attending: Obstetrics and Gynecology | Admitting: Obstetrics and Gynecology

## 2017-02-06 ENCOUNTER — Emergency Department: Payer: Medicaid Other

## 2017-02-06 ENCOUNTER — Encounter: Payer: Self-pay | Admitting: Emergency Medicine

## 2017-02-06 DIAGNOSIS — M79605 Pain in left leg: Secondary | ICD-10-CM | POA: Diagnosis present

## 2017-02-06 DIAGNOSIS — O26892 Other specified pregnancy related conditions, second trimester: Principal | ICD-10-CM | POA: Insufficient documentation

## 2017-02-06 DIAGNOSIS — M5440 Lumbago with sciatica, unspecified side: Secondary | ICD-10-CM | POA: Diagnosis not present

## 2017-02-06 DIAGNOSIS — W19XXXA Unspecified fall, initial encounter: Secondary | ICD-10-CM | POA: Insufficient documentation

## 2017-02-06 DIAGNOSIS — Z3A26 26 weeks gestation of pregnancy: Secondary | ICD-10-CM | POA: Diagnosis not present

## 2017-02-06 DIAGNOSIS — Z79899 Other long term (current) drug therapy: Secondary | ICD-10-CM | POA: Diagnosis not present

## 2017-02-06 DIAGNOSIS — O99012 Anemia complicating pregnancy, second trimester: Secondary | ICD-10-CM | POA: Diagnosis not present

## 2017-02-06 HISTORY — DX: Anemia, unspecified: D64.9

## 2017-02-06 HISTORY — DX: Benign neoplasm of connective and other soft tissue, unspecified: D21.9

## 2017-02-06 HISTORY — DX: Anxiety disorder, unspecified: F41.9

## 2017-02-06 MED ORDER — ACETAMINOPHEN 325 MG PO TABS
650.0000 mg | ORAL_TABLET | Freq: Once | ORAL | Status: AC
  Administered 2017-02-06: 650 mg via ORAL
  Filled 2017-02-06: qty 2

## 2017-02-06 NOTE — ED Provider Notes (Signed)
[facility] Emergency Department Provider Note   ____________________________________________   I have reviewed the triage vital signs and the nursing notes.   HISTORY  Chief Complaint Leg Pain    HPI Heidi Mata is a 31 y.o. female presents to emergency department with left lower extremity pain, numbness and weakness. Patient reports falling earlier today. Patient reports wanting to get out of bed because she of left lower extremity pain and when she went to step her "leg gave way". She noted pain and numbness of the left leg in addition to lower abdominal pain. Patient is currently [redacted] weeks pregnant. She denies any prior issues with her pregnancy, compliant with all prenatal care and denies any previous issues with prior pregnancies. Patient reports noting improvement of the baby following the fall. Patient denies fever, chills, headache, vision changes, chest pain, chest tightness, shortness of breath, abdominal pain, nausea and vomiting.  Past Medical History:  Diagnosis Date  . Hemosiderosis   . Migraines     There are no active problems to display for this patient.   Past Surgical History:  Procedure Laterality Date  . BREAST SURGERY     cyst removal, right  . CYST EXCISION    . ECTOPIC PREGNANCY SURGERY  2009  . LAPAROSCOPY FOR ECTOPIC PREGNANCY      Prior to Admission medications   Medication Sig Start Date End Date Taking? Authorizing Provider  acyclovir (ZOVIRAX) 800 MG tablet Take 1 tablet (800 mg total) by mouth 5 (five) times daily. 07/22/15   Earleen Newport, MD  doxycycline (VIBRAMYCIN) 100 MG capsule Take 1 capsule (100 mg) by mouth twice daily for 10 days. 03/21/16   Hinda Kehr, MD  ibuprofen (ADVIL,MOTRIN) 800 MG tablet Take 1 tablet (800 mg total) by mouth every 8 (eight) hours as needed for moderate pain. 12/10/15   Paulette Blanch, MD  metroNIDAZOLE (FLAGYL) 500 MG tablet Take 1 tablet (500 mg total) by mouth 2 (two)  times daily. 03/21/16   Hinda Kehr, MD  naproxen (NAPROSYN) 500 MG tablet Take 1 tablet (500 mg total) by mouth 2 (two) times daily with a meal. 08/07/16   Triplett, Cari B, FNP  ondansetron (ZOFRAN ODT) 4 MG disintegrating tablet Allow 1-2 tablets to dissolve in your mouth every 8 hours as needed for nausea/vomiting 03/21/16   Hinda Kehr, MD  ondansetron (ZOFRAN ODT) 4 MG disintegrating tablet Take 1 tablet (4 mg total) by mouth every 8 (eight) hours as needed for nausea or vomiting. 10/18/16   Earleen Newport, MD  oxyCODONE-acetaminophen (ROXICET) 5-325 MG tablet Take 1 tablet by mouth every 4 (four) hours as needed for severe pain. 12/10/15   Paulette Blanch, MD  predniSONE (STERAPRED UNI-PAK 21 TAB) 10 MG (21) TBPK tablet Take 6 tablets on day 1 Take 5 tablets on day 2 Take 4 tablets on day 3 Take 3 tablets on day 4 Take 2 tablets on day 5 Take 1 tablet on day 6 Patient not taking: Reported on 08/30/2014 08/24/14   Sherrie George B, FNP  predniSONE (STERAPRED UNI-PAK 21 TAB) 10 MG (21) TBPK tablet Take steroid taper pak as directed 07/22/15   Earleen Newport, MD  PRESCRIPTION MEDICATION as needed (for migraines).    [provider]  tiZANidine (ZANAFLEX) 4 MG tablet Take 1 tablet (4 mg total) by mouth 3 (three) times daily. 08/07/16   Triplett, Johnette Abraham B, FNP  trimethoprim-polymyxin b (POLYTRIM) ophthalmic solution Place 1 drop into the left  eye every 4 (four) hours. X 5 days 12/10/14   Baron Sane, PA-C  Vitamins A & D (RA VITAMIN A & D) OINT Apply 1 application topically 3 (three) times daily. Patient not taking: Reported on 08/30/2014 08/24/14   Sherrie George B, FNP    Allergies Lactose intolerance (gi) and Sulfa antibiotics  No family history on file.  Social History Social History  Substance Use Topics  . Smoking status: Never Smoker  . Smokeless tobacco: Never Used  . Alcohol use No    Review of Systems Constitutional: Negative for  fever/chills Cardiovascular: Denies chest pain. Respiratory: Denies cough. Denies shortness of breath. Gastrointestinal: positive for lower abdominal pain. No nausea, vomiting, diarrhea.  Musculoskeletal: left lower extremity pain, numbness or weakness. Skin: Negative for rash. Neurological: Negative for headaches. Negative for loss of consciousness. Able to ambulate. ____________________________________________   PHYSICAL EXAM:  VITAL SIGNS: ED Triage Vitals  Enc Vitals Group     BP 02/06/17 1430 110/69     Pulse Rate 02/06/17 1430 (!) 109     Resp 02/06/17 1430 18     Temp 02/06/17 1430 98.7 F (37.1 C)     Temp Source 02/06/17 1430 Oral     SpO2 02/06/17 1430 100 %     Weight 02/06/17 1431 157 lb (71.2 kg)     Height 02/06/17 1431 5\' 2"  (1.575 m)     Head Circumference --      Peak Flow --      Pain Score 02/06/17 1429 7     Pain Loc --      Pain Edu? --      Excl. in [place]? --     Constitutional: Alert and oriented. Well appearing and in no acute distress. Noted normal fetal movement during exam.  Eyes: Conjunctivae are normal. PERRL. EOMI  Head: Normocephalic and atraumatic. Cardiovascular: Normal rate, regular rhythm.  Respiratory: Normal respiratory effort without tachypnea or retractions.  Gastrointestinal: Bowel sounds 4 quadrants. Soft and nontender to palpation. No guarding or rigidity.  Musculoskeletal: Left lower extremity ROM intact. MMT plantarflexion, quad, abductors: 4/5. Decreased sensation in dermatomes L3-S1.  Neurologic: Normal speech and language.  Skin:  Skin is warm, dry and intact. No rash noted. Psychiatric: Mood and affect are normal. Speech and behavior are normal. Patient exhibits appropriate insight and judgement.  ____________________________________________   LABS (all labs ordered are listed, but only abnormal results are displayed)  Labs Reviewed - No data to  display ____________________________________________  EKG none ____________________________________________  RADIOLOGY US venous Img lower unilateral Left IMPRESSION: No evidence of LEFT lower extremity deep venous thrombosis. ____________________________________________   PROCEDURES  Procedure(s) performed: no    Critical Care performed: no ____________________________________________   INITIAL IMPRESSION / ASSESSMENT AND PLAN / ED COURSE  Pertinent labs & imaging results that were available during my care of the patient were reviewed by me and considered in my medical decision making (see chart for details).  Patient presents to emergency department with left lower extremity pain, numbness and weakness. History, physical exam findings and imaging are consistent with lumbar nerve root irritation/sciatica. US imaging was unremarkable for DVT. Patient noted mild reduction of pain with Tylenol given during the course of care in the emergency department. Patient reported continued activity/movement of the fetus throughout ED visit.  Patient advised to continue Tylenol as needed for pain. Patient advised to follow up with OB/Gyn  as needed or return to the emergency department if symptoms return or worsen. Patient informed of  clinical course, understand medical decision-making process, and agree with plan.  Patient will be discharged from the ED and readmitted to OB/L&D for evaluation.  ----------------------------------------- 4:45 PM on 02/06/2017 -----------------------------------------  ____________________________________________   FINAL CLINICAL IMPRESSION(S) / ED DIAGNOSES  Final diagnoses:  Left leg pain  Back pain of lumbar region with sciatica       NEW MEDICATIONS STARTED DURING THIS VISIT:  New Prescriptions   No medications on file     Note:  This document was prepared using Dragon voice recognition software and may include unintentional dictation  errors.    Jerolyn Shin, PA-C 02/06/17 [place], Potter, MD 02/09/17 [phone]

## 2017-02-06 NOTE — ED Triage Notes (Signed)
Patient presents to the ED after a fall this morning.  Patient is [redacted] weeks pregnant.  Patient states this morning her left leg felt numb and so she elevated leg on a pillow for a few hours.  Patient states when she tried to get out of bed her left leg gave out and she fell.  Patient states she can still feel the baby move but the movement feel painful.  Patient states her abdomen is having intermittent pain.  Patient reports pain when trying to use the bathroom earlier.

## 2017-02-06 NOTE — OB Triage Note (Signed)
Fell on right side into [employer]. Denies ctx. Reports good fetal movement. Denies gush of fluid. Denies bleeding. Dineen Kid

## 2017-02-06 NOTE — Final Progress Note (Signed)
TRIAGE NOTE to rule out Preterm Labor   History of Present Illness: Heidi Mata is a 31 y.o. G3P1010 at [redacted]w[redacted]d presenting to triage after visit to the ER because of leg pain. About 7 hours ago she fell onto her right side while carrying a laundry basket, which landed on her bellly. No pain there now, no decreased fetal movement, no evidence of PPROM or vaginal bleeding.  Monitoring is reassuring.  She was cleared by the ER with sciatica    Patient Active Problem List   Diagnosis Date Noted  . Labor and delivery, indication for care 02/06/2017    Past Medical History:  Diagnosis Date  . Anemia   . Anxiety   . Fibroid   . Hemosiderosis   . Migraines     Past Surgical History:  Procedure Laterality Date  . BREAST SURGERY     cyst removal, right  . CYST EXCISION    . ECTOPIC PREGNANCY SURGERY  2009  . LAPAROSCOPY FOR ECTOPIC PREGNANCY      OB History  Gravida Para Term Preterm AB Living  3 1 1  0 1    SAB TAB Ectopic Multiple Live Births      1   1    # Outcome Date GA Lbr Len/2nd Weight Sex Delivery Anes PTL Lv  3 Current           2 Ectopic           1 Term               Social History   Social History  . Marital status: Single    Spouse name: N/A  . Number of children: N/A  . Years of education: N/A   Social History Main Topics  . Smoking status: Never Smoker  . Smokeless tobacco: Never Used  . Alcohol use No  . Drug use: No  . Sexual activity: Yes    Birth control/ protection: None   Other Topics Concern  . None   Social History Narrative  . None    No family history on file.  Allergies  Allergen Reactions  . Lactose Intolerance (Gi) Other (See Comments)    "makes my lungs bleed"  . Sulfa Antibiotics Hives  . Zoloft [Sertraline Hcl] Other (See Comments)    Hallucinations  . Flagyl [Metronidazole] Nausea And Vomiting    Prescriptions Prior to Admission  Medication Sig Dispense Refill Last Dose  . ferrous sulfate 325 (65 FE) MG  tablet Take 325 mg by mouth daily with breakfast.   Past Week at Unknown time  . ondansetron (ZOFRAN ODT) 4 MG disintegrating tablet Allow 1-2 tablets to dissolve in your mouth every 8 hours as needed for nausea/vomiting 30 tablet 0 Past Month at Unknown time  . ondansetron (ZOFRAN ODT) 4 MG disintegrating tablet Take 1 tablet (4 mg total) by mouth every 8 (eight) hours as needed for nausea or vomiting. 20 tablet 0 Past Month at Unknown time  . Prenatal Vit-Fe Fumarate-FA (MULTIVITAMIN-PRENATAL) 27-0.8 MG TABS tablet Take 1 tablet by mouth daily at 12 noon.   Past Week at Unknown time  . acyclovir (ZOVIRAX) 800 MG tablet Take 1 tablet (800 mg total) by mouth 5 (five) times daily. (Patient not taking: Reported on 02/06/2017) 35 tablet 0 Not Taking at Unknown time  . doxycycline (VIBRAMYCIN) 100 MG capsule Take 1 capsule (100 mg) by mouth twice daily for 10 days. (Patient not taking: Reported on 02/06/2017) 28 capsule 0 Not Taking at  Unknown time  . ibuprofen (ADVIL,MOTRIN) 800 MG tablet Take 1 tablet (800 mg total) by mouth every 8 (eight) hours as needed for moderate pain. (Patient not taking: Reported on 02/06/2017) 15 tablet 0 Not Taking at Unknown time  . metroNIDAZOLE (FLAGYL) 500 MG tablet Take 1 tablet (500 mg total) by mouth 2 (two) times daily. (Patient not taking: Reported on 02/06/2017) 28 tablet 0 Completed Course at Unknown time  . naproxen (NAPROSYN) 500 MG tablet Take 1 tablet (500 mg total) by mouth 2 (two) times daily with a meal. (Patient not taking: Reported on 02/06/2017) 30 tablet 0 Not Taking at Unknown time  . oxyCODONE-acetaminophen (ROXICET) 5-325 MG tablet Take 1 tablet by mouth every 4 (four) hours as needed for severe pain. (Patient not taking: Reported on 02/06/2017) 15 tablet 0 Not Taking at Unknown time  . predniSONE (STERAPRED UNI-PAK 21 TAB) 10 MG (21) TBPK tablet Take 6 tablets on day 1 Take 5 tablets on day 2 Take 4 tablets on day 3 Take 3 tablets on day 4 Take 2  tablets on day 5 Take 1 tablet on day 6 (Patient not taking: Reported on 08/30/2014) 21 tablet 0 Completed Course at Unknown time  . predniSONE (STERAPRED UNI-PAK 21 TAB) 10 MG (21) TBPK tablet Take steroid taper pak as directed (Patient not taking: Reported on 02/06/2017) 21 tablet 0 Not Taking at Unknown time  . PRESCRIPTION MEDICATION as needed (for migraines).   Past Month at Unknown time  . tiZANidine (ZANAFLEX) 4 MG tablet Take 1 tablet (4 mg total) by mouth 3 (three) times daily. 30 tablet 0   . trimethoprim-polymyxin b (POLYTRIM) ophthalmic solution Place 1 drop into the left eye every 4 (four) hours. X 5 days (Patient not taking: Reported on 02/06/2017) 10 mL 0 Not Taking at Unknown time  . Vitamins A & D (RA VITAMIN A & D) OINT Apply 1 application topically 3 (three) times daily. (Patient not taking: Reported on 08/30/2014) 454 g 1 Not Taking at Unknown time    Review of Systems - See HPI for OB specific ROS.   Vitals:  BP 107/66 (BP Location: Left Arm)   Pulse 93   Temp 98.4 F (36.9 C) (Oral)   Resp 16   Ht 5\' 2"  (1.575 m)   Wt 71.2 kg (157 lb)   LMP 07/25/2016 (Within Weeks)   SpO2 100%   BMI 28.72 kg/m  Physical Examination: CONSTITUTIONAL: Well-developed, well-nourished female in no acute distress.  HENT:  Normocephalic, atraumatic EYES: Conjunctivae and EOM are normal. No scleral icterus.  NECK: Normal range of motion, supple, SKIN: Skin is warm and dry. No rash noted. Not diaphoretic. No erythema. No pallor. [place]: Alert and oriented to person, place, and time. No gross cranial nerve deficit noted. PSYCHIATRIC: Normal mood and affect. Normal behavior. Normal judgment and thought content. ABDOMEN: Soft, nontender, nondistended, gravid.  Cervix: not examined Membranes: intact Fetal Monitoring: not reactive, but appropriate for gestational age 88: Flat  Labs:  No results found for this or any previous visit (from the past 24 hour(s)).  Imaging  Studies: US Venous Img Lower Unilateral Left  Result Date: 02/06/2017 CLINICAL DATA:  Numbness and weakness. Twenty-six weeks pregnant. History of pulmonary embolism. EXAM: LEFT LOWER EXTREMITY VENOUS DOPPLER ULTRASOUND TECHNIQUE: Gray-scale sonography with graded compression, as well as color Doppler and duplex ultrasound were performed to evaluate the lower extremity deep venous systems from the level of the common femoral vein and including the common femoral, femoral, profunda  femoral, popliteal and calf veins including the posterior tibial, peroneal and gastrocnemius veins when visible. The superficial great saphenous vein was also interrogated. Spectral Doppler was utilized to evaluate flow at rest and with distal augmentation maneuvers in the common femoral, femoral and popliteal veins. COMPARISON:  None. FINDINGS: Contralateral Common Femoral Vein: Respiratory phasicity is normal and symmetric with the symptomatic side. No evidence of thrombus. Normal compressibility. Common Femoral Vein: No evidence of thrombus. Normal compressibility, respiratory phasicity and response to augmentation. Profunda Femoral Vein: No evidence of thrombus. Normal compressibility and flow on color Doppler imaging. Femoral Vein: No evidence of thrombus. Normal compressibility, respiratory phasicity and response to augmentation. Popliteal Vein: No evidence of thrombus. Normal compressibility, respiratory phasicity and response to augmentation. Calf Veins: No evidence of thrombus. Normal compressibility and flow on color Doppler imaging. Other Findings:  None. IMPRESSION: No evidence of LEFT lower extremity deep venous thrombosis. Electronically Signed   By: Elon Alas M.D.   On: 02/06/2017 16:26     Assessment and Plan: Patient Active Problem List   Diagnosis Date Noted  . Labor and delivery, indication for care 02/06/2017    1. No evidence of feetal trauma or distress.  2. NST non reactive but reassuring for  gesttaional age 64. D/c home with precautions and f/u in clinic as scheduled  Angelina Pih, MD, MPH

## 2017-02-06 NOTE — Discharge Instructions (Signed)
Continue taking Tylenol as needed for pain.  You were evaluated today for left lower extremity pain and numbness. Symptoms are associated with nerve irritation/sciatica.

## 2017-02-06 NOTE — ED Notes (Signed)
This RN called labor and delivery to check if patient was appropriate for them or if she needed an ED exam first.  Spoke to Cecille Rubin, RN who stated patient should be seen in the ED first and then sent to L&D for exam.

## 2017-04-14 LAB — OB RESULTS CONSOLE HIV ANTIBODY (ROUTINE TESTING): HIV: NONREACTIVE

## 2017-04-16 LAB — OB RESULTS CONSOLE GC/CHLAMYDIA
Chlamydia: NEGATIVE
GC PROBE AMP, GENITAL: NEGATIVE

## 2017-04-16 LAB — OB RESULTS CONSOLE GBS: STREP GROUP B AG: POSITIVE

## 2017-04-19 NOTE — L&D Delivery Note (Signed)
Date of delivery: 05/07/17 Estimated Date of Delivery: 05/08/17 Patient's last menstrual period was 08/01/2016. EGA: [redacted]w[redacted]d  Delivery Note At 12:06 AM a viable  female was delivered via Vaginal, Spontaneous (Presentation: cephalic; ROA).  APGAR: 9, 9; weight pending .   Placenta status: spontaneous, intact.  Cord:3vv,  with the following complications: none apparent.  Cord pH: not collected  Anesthesia:  epidural Episiotomy: None Lacerations: None Suture Repair: n/a Est. Blood Loss (mL): 150cc (measured)  Mom presented to L&D with premature rupture of membranes, induced with cytotec, augmented with pitocin.  epidual placed. Progressed to complete, second stage: <30 mins,  delivery of fetal head with restitution to ROT.   Anterior then posterior shoulders delivered without difficulty.  Baby placed on mom's chest, and attended to by peds. Cord was then clamped and cut when pulseless.  Placenta spontaneously delivered, intact.   IV pitocin given for hemorrhage prophylaxis.   We sang happy birthday to baby Heidi Mata.  Mom to postpartum.  Baby to Couplet care / Skin to Skin.  Kalyb Pemble C Ceria Suminski 05/07/2017, 12:22 AM

## 2017-04-28 ENCOUNTER — Inpatient Hospital Stay
Admission: EM | Admit: 2017-04-28 | Discharge: 2017-04-28 | Disposition: A | Discharge status: Home / Self Care | Payer: Medicaid Other | Attending: Obstetrics and Gynecology | Admitting: Obstetrics and Gynecology

## 2017-04-28 ENCOUNTER — Other Ambulatory Visit: Payer: Self-pay

## 2017-04-28 DIAGNOSIS — Z3A38 38 weeks gestation of pregnancy: Secondary | ICD-10-CM | POA: Insufficient documentation

## 2017-04-28 DIAGNOSIS — Z79899 Other long term (current) drug therapy: Secondary | ICD-10-CM | POA: Diagnosis not present

## 2017-04-28 DIAGNOSIS — O479 False labor, unspecified: Secondary | ICD-10-CM

## 2017-04-28 DIAGNOSIS — O471 False labor at or after 37 completed weeks of gestation: Secondary | ICD-10-CM | POA: Diagnosis not present

## 2017-04-28 NOTE — Discharge Instructions (Signed)
Drink plenty of fluid and get plenty of rest. Call your provider for any other concerns. Call to reschedule your appointment you missed today

## 2017-04-28 NOTE — OB Triage Note (Signed)
Heidi Mata here with report of ctx since 82, denies bleeding, LOF, reports decreased fetal movement. Last food at 0900, denies smoking, drug use, no recent intercourse

## 2017-04-28 NOTE — Progress Notes (Addendum)
Patient ID: [id number] LABELLA, female   DOB: 02/23/1986, 32 y.o.   MRN: [id number]  DELAYLA HOFFMASTER is a 32 y.o. female. She is at [redacted]w[redacted]d gestation. Patient's last menstrual period was 07/25/2016 (within weeks). Estimated Date of Delivery: 05/11/17  Prenatal care site: [facility]   Chief complaint: UC's starting since 0300  Location: abd  Onset/timing:hurting since 0300am  Duration:Intermittant Quality: Hard  Severity: Hurting  Aggravating or alleviating conditions:None Associated signs/symptoms:None  Context:Intermittant pain   S: Resting comfortably. no CTX, no VB.no LOF,  Active fetal movement.   Maternal Medical History:   Past Medical History:  Diagnosis Date  . Anemia   . Anxiety   . Fibroid   . Hemosiderosis   . Migraines     Past Surgical History:  Procedure Laterality Date  . BREAST SURGERY     cyst removal, right  . CYST EXCISION    . ECTOPIC PREGNANCY SURGERY  2009  . LAPAROSCOPY FOR ECTOPIC PREGNANCY      Allergies  Allergen Reactions  . Lactose Intolerance (Gi) Other (See Comments)    "makes my lungs bleed"  . Sulfa Antibiotics Hives  . Zoloft [Sertraline Hcl] Other (See Comments)    Hallucinations  . Flagyl [Metronidazole] Nausea And Vomiting    Prior to Admission medications   Medication Sig Start Date End Date Taking? Authorizing Provider  ferrous sulfate 325 (65 FE) MG tablet Take 325 mg by mouth daily with breakfast.   Yes [provider]  ondansetron (ZOFRAN ODT) 4 MG disintegrating tablet Take 1 tablet (4 mg total) by mouth every 8 (eight) hours as needed for nausea or vomiting. 10/18/16  Yes Earleen Newport, MD  Prenatal Vit-Fe Fumarate-FA (MULTIVITAMIN-PRENATAL) 27-0.8 MG TABS tablet Take 1 tablet by mouth daily at 12 noon.   Yes [provider]  acyclovir (ZOVIRAX) 800 MG tablet Take 1 tablet (800 mg total) by mouth 5 (five) times daily. Patient not taking: Reported on 02/06/2017 07/22/15   Earleen Newport,  MD  doxycycline (VIBRAMYCIN) 100 MG capsule Take 1 capsule (100 mg) by mouth twice daily for 10 days. Patient not taking: Reported on 02/06/2017 03/21/16   Hinda Kehr, MD  ibuprofen (ADVIL,MOTRIN) 800 MG tablet Take 1 tablet (800 mg total) by mouth every 8 (eight) hours as needed for moderate pain. Patient not taking: Reported on 02/06/2017 12/10/15   Paulette Blanch, MD  metroNIDAZOLE (FLAGYL) 500 MG tablet Take 1 tablet (500 mg total) by mouth 2 (two) times daily. 03/21/16   Hinda Kehr, MD  naproxen (NAPROSYN) 500 MG tablet Take 1 tablet (500 mg total) by mouth 2 (two) times daily with a meal. Patient not taking: Reported on 02/06/2017 08/07/16   Sherrie George B, FNP  ondansetron (ZOFRAN ODT) 4 MG disintegrating tablet Allow 1-2 tablets to dissolve in your mouth every 8 hours as needed for nausea/vomiting 03/21/16   Hinda Kehr, MD  oxyCODONE-acetaminophen (ROXICET) 5-325 MG tablet Take 1 tablet by mouth every 4 (four) hours as needed for severe pain. Patient not taking: Reported on 02/06/2017 12/10/15   Paulette Blanch, MD  predniSONE (STERAPRED UNI-PAK 21 TAB) 10 MG (21) TBPK tablet Take 6 tablets on day 1 Take 5 tablets on day 2 Take 4 tablets on day 3 Take 3 tablets on day 4 Take 2 tablets on day 5 Take 1 tablet on day 6 Patient not taking: Reported on 08/30/2014 08/24/14   Sherrie George B, FNP  predniSONE (STERAPRED UNI-PAK 21 TAB) 10 MG (  21) TBPK tablet Take steroid taper pak as directed Patient not taking: Reported on 02/06/2017 07/22/15   Earleen Newport, MD  PRESCRIPTION MEDICATION as needed (for migraines).    [provider]  tiZANidine (ZANAFLEX) 4 MG tablet Take 1 tablet (4 mg total) by mouth 3 (three) times daily. 08/07/16   Triplett, Johnette Abraham B, FNP  trimethoprim-polymyxin b (POLYTRIM) ophthalmic solution Place 1 drop into the left eye every 4 (four) hours. X 5 days Patient not taking: Reported on 02/06/2017 12/10/14   Baron Sane, PA-C  Vitamins A & D (RA VITAMIN  A & D) OINT Apply 1 application topically 3 (three) times daily. Patient not taking: Reported on 08/30/2014 08/24/14   Victorino Dike, FNP     Social History: She  reports that  has never smoked. she has never used smokeless tobacco. She reports that she does not drink alcohol or use drugs.  Family History: family history is not on file.  no history of gyn cancers  Review of Systems: A full review of systems was performed and negative except as noted in the HPI.     O:  BP 110/65 (BP Location: Right Arm)   Pulse 84   Temp 98.2 F (36.8 C) (Oral)   Resp 18   LMP 07/25/2016 (Within Weeks)  No results found for this or any previous visit (from the past 48 hour(s)).   Constitutional: NAD, AAOx3  HE/ENT: extraocular movements grossly intact, moist mucous membranes CV: RRR PULM: nl respiratory effort, CTABL     Abd: gravid, non-tender, non-distended, soft      Ext: Non-tender, Nonedmeatous   Psych: mood appropriate, speech normal Pelvic:2/Thick/vt -2/-3  NST: after juice, baby became reactive with 2 accels 15 x 15 BPM Baseline: 130 Variability: moderate Accelerations present x >2 Decelerations absent Time 75mins    A/P: 32 y.o. [redacted]w[redacted]d here for antenatal surveillance for early labor   Labor: not present.   Fetal Wellbeing: Reassuring Cat 1 tracing.  Reactive NST   D/c home stable, precautions reviewed, follow-up as scheduled.   ----- Glenis Smoker, CNM [facility], Department of [facility]

## 2017-04-28 NOTE — Discharge Summary (Signed)
Obstetric Discharge Summary Reason for Admission: onset of labor Prenatal Procedures: ultrasound Intrapartum Procedures: NST only  Postpartum Procedures: N/A Complications-Operative and Postpartum: N/A Hemoglobin  Date Value Ref Range Status  10/18/2016 11.6 (L) 12.0 - 16.0 g/dL Final   HGB  Date Value Ref Range Status  08/02/2013 10.4 (L) 12.0 - 16.0 g/dL Final   HCT  Date Value Ref Range Status  10/18/2016 34.8 (L) 35.0 - 47.0 % Final  08/02/2013 33.1 (L) 35.0 - 47.0 % Final    Physical Exam:  General: A,A&O x3 Heart:S1S2, RRR, No M/R/G Uterus: Gravid  DVT Evaluation:Neg [place] Discharge Diagnoses: Term pregnancy   Discharge Information: Date: 04/28/2017 Activity: Up abd lib Diet: Regular  Medications: PNV,  Condition:Stable  Instructions: FKC's, Labor instructions Discharge JO:ITGP  Newborn Data: This patient has no babies on file.   Heidi Mata 04/28/2017, 1:38 PM

## 2017-05-05 ENCOUNTER — Other Ambulatory Visit: Payer: Self-pay | Admitting: Obstetrics and Gynecology

## 2017-05-05 NOTE — Progress Notes (Signed)
duplicate

## 2017-05-06 ENCOUNTER — Inpatient Hospital Stay: Payer: Medicaid Other | Admitting: Anesthesiology

## 2017-05-06 ENCOUNTER — Other Ambulatory Visit: Payer: Self-pay

## 2017-05-06 ENCOUNTER — Inpatient Hospital Stay
Admission: EM | Admit: 2017-05-06 | Discharge: 2017-05-08 | DRG: 806 | Disposition: A | Discharge status: Home / Self Care | Payer: Medicaid Other | Attending: Obstetrics & Gynecology | Admitting: Obstetrics & Gynecology

## 2017-05-06 DIAGNOSIS — D649 Anemia, unspecified: Secondary | ICD-10-CM | POA: Diagnosis present

## 2017-05-06 DIAGNOSIS — Z3A39 39 weeks gestation of pregnancy: Secondary | ICD-10-CM

## 2017-05-06 DIAGNOSIS — O0993 Supervision of high risk pregnancy, unspecified, third trimester: Secondary | ICD-10-CM

## 2017-05-06 DIAGNOSIS — O99324 Drug use complicating childbirth: Secondary | ICD-10-CM | POA: Diagnosis present

## 2017-05-06 DIAGNOSIS — O99820 Streptococcus B carrier state complicating pregnancy: Secondary | ICD-10-CM

## 2017-05-06 DIAGNOSIS — O9902 Anemia complicating childbirth: Secondary | ICD-10-CM | POA: Diagnosis present

## 2017-05-06 DIAGNOSIS — O4292 Full-term premature rupture of membranes, unspecified as to length of time between rupture and onset of labor: Principal | ICD-10-CM | POA: Diagnosis present

## 2017-05-06 DIAGNOSIS — A6000 Herpesviral infection of urogenital system, unspecified: Secondary | ICD-10-CM | POA: Diagnosis present

## 2017-05-06 DIAGNOSIS — F1290 Cannabis use, unspecified, uncomplicated: Secondary | ICD-10-CM | POA: Diagnosis present

## 2017-05-06 DIAGNOSIS — O9832 Other infections with a predominantly sexual mode of transmission complicating childbirth: Secondary | ICD-10-CM | POA: Diagnosis present

## 2017-05-06 DIAGNOSIS — O99019 Anemia complicating pregnancy, unspecified trimester: Secondary | ICD-10-CM | POA: Diagnosis present

## 2017-05-06 DIAGNOSIS — Z8619 Personal history of other infectious and parasitic diseases: Secondary | ICD-10-CM | POA: Diagnosis present

## 2017-05-06 DIAGNOSIS — O99824 Streptococcus B carrier state complicating childbirth: Secondary | ICD-10-CM | POA: Diagnosis present

## 2017-05-06 DIAGNOSIS — O4290 Premature rupture of membranes, unspecified as to length of time between rupture and onset of labor, unspecified weeks of gestation: Secondary | ICD-10-CM | POA: Diagnosis present

## 2017-05-06 LAB — CBC
HEMATOCRIT: 33.7 % — AB (ref 35.0–47.0)
HEMOGLOBIN: 10.6 g/dL — AB (ref 12.0–16.0)
MCH: 25.2 pg — AB (ref 26.0–34.0)
MCHC: 31.4 g/dL — ABNORMAL LOW (ref 32.0–36.0)
MCV: 80.2 fL (ref 80.0–100.0)
PLATELETS: 219 10*3/uL (ref 150–440)
RBC: 4.21 MIL/uL (ref 3.80–5.20)
RDW: 16.2 % — ABNORMAL HIGH (ref 11.5–14.5)
WBC: 9.1 10*3/uL (ref 3.6–11.0)

## 2017-05-06 LAB — URINE DRUG SCREEN, QUALITATIVE (ARMC ONLY)
Amphetamines, Ur Screen: NOT DETECTED
Barbiturates, Ur Screen: NOT DETECTED
Benzodiazepine, Ur Scrn: NOT DETECTED
COCAINE METABOLITE, UR ~~LOC~~: NOT DETECTED
Cannabinoid 50 Ng, Ur ~~LOC~~: POSITIVE — AB
MDMA (ECSTASY) UR SCREEN: NOT DETECTED
METHADONE SCREEN, URINE: NOT DETECTED
Opiate, Ur Screen: NOT DETECTED
Phencyclidine (PCP) Ur S: NOT DETECTED
TRICYCLIC, UR SCREEN: NOT DETECTED

## 2017-05-06 LAB — TYPE AND SCREEN
ABO/RH(D): O POS
Antibody Screen: NEGATIVE

## 2017-05-06 MED ORDER — LACTATED RINGERS IV SOLN
500.0000 mL | INTRAVENOUS | Status: DC | PRN
  Administered 2017-05-06 (×3): 500 mL via INTRAVENOUS

## 2017-05-06 MED ORDER — PHENYLEPHRINE 40 MCG/ML (10ML) SYRINGE FOR IV PUSH (FOR BLOOD PRESSURE SUPPORT)
80.0000 ug | PREFILLED_SYRINGE | INTRAVENOUS | Status: DC | PRN
  Filled 2017-05-06: qty 5

## 2017-05-06 MED ORDER — AMMONIA AROMATIC IN INHA
RESPIRATORY_TRACT | Status: AC
  Filled 2017-05-06: qty 10

## 2017-05-06 MED ORDER — OXYTOCIN 40 UNITS IN LACTATED RINGERS INFUSION - SIMPLE MED: 2.5000 [IU]/h | INTRAVENOUS | Status: DC

## 2017-05-06 MED ORDER — LACTATED RINGERS IV SOLN: 500.0000 mL | Freq: Once | INTRAVENOUS | Status: DC

## 2017-05-06 MED ORDER — FENTANYL 2.5 MCG/ML W/ROPIVACAINE 0.15% IN NS 100 ML EPIDURAL (ARMC)
EPIDURAL | Status: AC
  Filled 2017-05-06: qty 100

## 2017-05-06 MED ORDER — ONDANSETRON HCL 4 MG/2ML IJ SOLN
4.0000 mg | Freq: Four times a day (QID) | INTRAMUSCULAR | Status: DC | PRN
  Administered 2017-05-06: 4 mg via INTRAVENOUS
  Filled 2017-05-06: qty 2

## 2017-05-06 MED ORDER — LACTATED RINGERS IV SOLN
INTRAVENOUS | Status: DC
  Administered 2017-05-06 (×2): via INTRAVENOUS

## 2017-05-06 MED ORDER — SOD CITRATE-CITRIC ACID 500-334 MG/5ML PO SOLN: 30.0000 mL | ORAL | Status: DC | PRN

## 2017-05-06 MED ORDER — MISOPROSTOL 200 MCG PO TABS
ORAL_TABLET | ORAL | Status: AC
  Administered 2017-05-06: 25 ug
  Filled 2017-05-06: qty 4

## 2017-05-06 MED ORDER — FENTANYL CITRATE (PF) 100 MCG/2ML IJ SOLN: 25.0000 ug | Freq: Once | INTRAMUSCULAR | Status: DC

## 2017-05-06 MED ORDER — BUTORPHANOL TARTRATE 1 MG/ML IJ SOLN: 1.0000 mg | INTRAMUSCULAR | Status: DC | PRN

## 2017-05-06 MED ORDER — LIDOCAINE HCL (PF) 1 % IJ SOLN
30.0000 mL | INTRAMUSCULAR | Status: AC | PRN
  Administered 2017-05-06: 1.5 mL via SUBCUTANEOUS

## 2017-05-06 MED ORDER — PENICILLIN G POT IN DEXTROSE 60000 UNIT/ML IV SOLN
3.0000 10*6.[IU] | INTRAVENOUS | Status: DC
  Administered 2017-05-06 (×2): 3 10*6.[IU] via INTRAVENOUS
  Filled 2017-05-06 (×8): qty 50

## 2017-05-06 MED ORDER — FENTANYL 2.5 MCG/ML W/ROPIVACAINE 0.15% IN NS 100 ML EPIDURAL (ARMC)
EPIDURAL | Status: DC | PRN
  Administered 2017-05-06: 12 mL/h via EPIDURAL

## 2017-05-06 MED ORDER — OXYTOCIN 10 UNIT/ML IJ SOLN
INTRAMUSCULAR | Status: AC
  Filled 2017-05-06: qty 2

## 2017-05-06 MED ORDER — PENICILLIN G POTASSIUM 5000000 UNITS IJ SOLR
5.0000 10*6.[IU] | Freq: Once | INTRAVENOUS | Status: AC
  Administered 2017-05-06: 5 10*6.[IU] via INTRAVENOUS
  Filled 2017-05-06: qty 5

## 2017-05-06 MED ORDER — EPHEDRINE 5 MG/ML INJ
10.0000 mg | INTRAVENOUS | Status: DC | PRN
  Filled 2017-05-06: qty 2

## 2017-05-06 MED ORDER — FENTANYL 2.5 MCG/ML W/ROPIVACAINE 0.15% IN NS 100 ML EPIDURAL (ARMC): 12.0000 mL/h | EPIDURAL | Status: DC

## 2017-05-06 MED ORDER — LIDOCAINE HCL (PF) 1 % IJ SOLN
INTRAMUSCULAR | Status: AC
  Filled 2017-05-06: qty 30

## 2017-05-06 MED ORDER — TERBUTALINE SULFATE 1 MG/ML IJ SOLN: 0.2500 mg | Freq: Once | INTRAMUSCULAR | Status: DC | PRN

## 2017-05-06 MED ORDER — DIPHENHYDRAMINE HCL 50 MG/ML IJ SOLN
12.5000 mg | INTRAMUSCULAR | Status: DC | PRN
Start: 2017-05-06 — End: 2017-05-07

## 2017-05-06 MED ORDER — ACETAMINOPHEN 500 MG PO TABS
1000.0000 mg | ORAL_TABLET | Freq: Four times a day (QID) | ORAL | Status: DC | PRN
  Administered 2017-05-06: 1000 mg via ORAL
  Filled 2017-05-06: qty 2

## 2017-05-06 MED ORDER — OXYTOCIN BOLUS FROM INFUSION
500.0000 mL | Freq: Once | INTRAVENOUS | Status: AC
  Administered 2017-05-07: 500 mL via INTRAVENOUS

## 2017-05-06 MED ORDER — LIDOCAINE-EPINEPHRINE (PF) 1.5 %-1:200000 IJ SOLN
INTRAMUSCULAR | Status: DC | PRN
  Administered 2017-05-06: 3 mL via EPIDURAL

## 2017-05-06 MED ORDER — OXYTOCIN 40 UNITS IN LACTATED RINGERS INFUSION - SIMPLE MED
1.0000 m[IU]/min | INTRAVENOUS | Status: DC
  Administered 2017-05-06: 2 m[IU]/min via INTRAVENOUS
  Filled 2017-05-06: qty 1000

## 2017-05-06 MED ORDER — MISOPROSTOL 25 MCG QUARTER TABLET
25.0000 ug | ORAL_TABLET | ORAL | Status: DC
  Administered 2017-05-06: 25 ug via ORAL
  Filled 2017-05-06 (×3): qty 1

## 2017-05-06 NOTE — H&P (Addendum)
OB History & Physical   History of Present Illness:  Chief Complaint: "my water broke"  HPI:  Heidi Mata is a 32 y.o. G45P1011 female at [redacted]w[redacted]d dated by LMP of 08/01/16. Estimated Date of Delivery: 05/08/17 She presents to L&D with complaints of spontaneous rupture of membranes at 0400 this morning.  Clear fluid   +FM, occasional CTX, + LOF, no VB  Pregnancy Issues: 1. 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 GBS + 5. Anemia  Maternal Medical History:   Past Medical History:  Diagnosis Date  . Anemia   . Anxiety   . Fibroid   . Hemosiderosis   . Migraines     Past Surgical History:  Procedure Laterality Date  . BREAST SURGERY     cyst removal, right  . CYST EXCISION    . ECTOPIC PREGNANCY SURGERY  2009  . LAPAROSCOPY FOR ECTOPIC PREGNANCY      Allergies  Allergen Reactions  . Lactose Intolerance (Gi) Other (See Comments)    "makes my lungs bleed"  . Sulfa Antibiotics Hives  . Zoloft [Sertraline Hcl] Other (See Comments)    Hallucinations  . Flagyl [Metronidazole] Nausea And Vomiting    Prior to Admission medications   Medication Sig Start Date End Date Taking? Authorizing Provider  acyclovir (ZOVIRAX) 800 MG tablet Take 1 tablet (800 mg total) by mouth 5 (five) times daily. 07/22/15  Yes Earleen Newport, MD  ferrous sulfate 325 (65 FE) MG tablet Take 325 mg by mouth daily with breakfast.   Yes [provider]  Prenatal Vit-Fe Fumarate-FA (MULTIVITAMIN-PRENATAL) 27-0.8 MG TABS tablet Take 1 tablet by mouth daily at 12 noon.   Yes [provider]  doxycycline (VIBRAMYCIN) 100 MG capsule Take 1 capsule (100 mg) by mouth twice daily for 10 days. Patient not taking: Reported on 02/06/2017 03/21/16   Hinda Kehr, MD  ibuprofen (ADVIL,MOTRIN) 800 MG tablet Take 1 tablet (800 mg total) by mouth every 8 (eight) hours as needed for moderate pain. Patient not taking:  Reported on 02/06/2017 12/10/15   Paulette Blanch, MD  metroNIDAZOLE (FLAGYL) 500 MG tablet Take 1 tablet (500 mg total) by mouth 2 (two) times daily. Patient not taking: Reported on 05/06/2017 03/21/16   Hinda Kehr, MD  naproxen (NAPROSYN) 500 MG tablet Take 1 tablet (500 mg total) by mouth 2 (two) times daily with a meal. Patient not taking: Reported on 02/06/2017 08/07/16   Victorino Dike, FNP  ondansetron (ZOFRAN ODT) 4 MG disintegrating tablet Allow 1-2 tablets to dissolve in your mouth every 8 hours as needed for nausea/vomiting Patient not taking: Reported on 05/06/2017 03/21/16   Hinda Kehr, MD  ondansetron (ZOFRAN ODT) 4 MG disintegrating tablet Take 1 tablet (4 mg total) by mouth every 8 (eight) hours as needed for nausea or vomiting. Patient not taking: Reported on 05/06/2017 10/18/16   Earleen Newport, MD  oxyCODONE-acetaminophen (ROXICET) 5-325 MG tablet Take 1 tablet by mouth every 4 (four) hours as needed for severe pain. Patient not taking: Reported on 02/06/2017 12/10/15   Paulette Blanch, MD  predniSONE (STERAPRED UNI-PAK 21 TAB) 10 MG (21) TBPK tablet Take 6 tablets on day 1 Take 5 tablets on day 2 Take 4 tablets on day 3 Take 3 tablets on day 4 Take 2 tablets on day 5 Take 1 tablet on day 6 Patient not taking: Reported on 08/30/2014 08/24/14   Vanessa [place],  Cari B, FNP  predniSONE (STERAPRED UNI-PAK 21 TAB) 10 MG (21) TBPK tablet Take steroid taper pak as directed Patient not taking: Reported on 02/06/2017 07/22/15   Earleen Newport, MD  PRESCRIPTION MEDICATION as needed (for migraines).    [provider]  tiZANidine (ZANAFLEX) 4 MG tablet Take 1 tablet (4 mg total) by mouth 3 (three) times daily. 08/07/16   Triplett, Johnette Abraham B, FNP  trimethoprim-polymyxin b (POLYTRIM) ophthalmic solution Place 1 drop into the left eye every 4 (four) hours. X 5 days Patient not taking: Reported on 02/06/2017 12/10/14   Baron Sane, PA-C  Vitamins A & D (RA VITAMIN A & D) OINT Apply  1 application topically 3 (three) times daily. Patient not taking: Reported on 08/30/2014 08/24/14   Victorino Dike, FNP     Prenatal care site: [facility] History: She  reports that  has never smoked. she has never used smokeless tobacco. She reports that she does not drink alcohol or use drugs.  Family History: maternal aunt with breast cancer  Review of Systems: A full review of systems was performed and negative except as noted in the HPI.     Physical Exam:  Vital Signs: BP 120/71 (BP Location: Left Arm)   Pulse 87   Temp 98.3 F (36.8 C) (Oral)   Resp 16   Ht 5\' 2"  (1.575 m)   Wt 83.5 kg (184 lb)   LMP 08/01/2016   BMI 33.65 kg/m  General: no acute distress.  HEENT: normocephalic, atraumatic Heart: regular rate & rhythm.  No murmurs/rubs/gallops Lungs: clear to auscultation bilaterally, normal respiratory effort Abdomen: soft, gravid, non-tender;  EFW: 7lbs Pelvic:   External: Normal external female genitalia  Cervix: Dilation: 1 / Effacement (%): Thick /     SSE: no lesions externally or internally   Extremities: non-tender, symmetric, no  edema bilaterally.  DTRs: 2+ Neurologic: Alert & oriented x 3.    No results found for this or any previous visit (from the past 24 hour(s)).  Pertinent Results:  Prenatal Labs: Blood type/Rh O pos  Antibody screen neg  Rubella Immune  Varicella Immune  RPR NR  HBsAg Neg  HIV NR  GC neg  Chlamydia neg  Genetic screening Quad/ afp negative  1 hour GTT 65  3 hour GTT --  GBS Positive    FHT: 130 mod + accels no decels TOCO: irritable SVE:  Dilation: 1 / Effacement (%): Thick /      Cephalic by leopolds   Assessment:  Heidi Mata is a 32 y.o. G38P1010 female at [redacted]w[redacted]d with PROM   Plan:  1. Admit to Labor & Delivery 2. CBC, T&S, IVF, reg diet 3. GBS positive - PCN starting now  4. Consents obtained. 5. Continuous efm/toco 6. Category 1 tracing 7. IOL with cytotec bucally, TBD after  dosing and response.  ----- Larey Days, MD Attending Obstetrician and Gynecologist [facility], Department of [facility]

## 2017-05-06 NOTE — Anesthesia Procedure Notes (Signed)
Epidural Patient location during procedure: OB Start time: 05/06/2017 9:54 PM End time: 05/06/2017 10:22 PM  Staffing Performed: anesthesiologist   Preanesthetic Checklist Completed: patient identified, site marked, surgical consent, pre-op evaluation, timeout performed, IV checked, risks and benefits discussed and monitors and equipment checked  Epidural Patient position: sitting Prep: Betadine Patient monitoring: heart rate, continuous pulse ox and blood pressure Approach: midline Location: L4-L5 Injection technique: LOR saline  Needle:  Needle type: Tuohy  Needle gauge: 17 G Needle length: 9 cm and 9 Needle insertion depth: 8 cm Catheter type: closed end flexible Catheter size: 20 Guage Catheter at skin depth: 14 cm Test dose: negative and 1.5% lidocaine with Epi 1:200 K  Assessment Events: blood not aspirated, injection not painful, no injection resistance, negative IV test and no paresthesia  Additional Notes   Patient tolerated the insertion well without complications.Reason for block:procedure for pain

## 2017-05-06 NOTE — Anesthesia Preprocedure Evaluation (Signed)
Anesthesia Evaluation  Patient identified by MRN, date of birth, ID band Patient awake    Reviewed: Allergy & Precautions, NPO status , Patient's Chart, lab work & pertinent test results  History of Anesthesia Complications Negative for: history of anesthetic complications  Airway Mallampati: III       Dental   Pulmonary neg sleep apnea, neg COPD,           Cardiovascular (-) hypertension(-) Past MI and (-) CHF (-) dysrhythmias (-) Valvular Problems/Murmurs     Neuro/Psych neg Seizures Anxiety    GI/Hepatic GERD (with pregnancy)  ,(+)     substance abuse  marijuana use,   Endo/Other  neg diabetes  Renal/GU negative Renal ROS     Musculoskeletal   Abdominal   Peds  Hematology  (+) anemia ,   Anesthesia Other Findings   Reproductive/Obstetrics                             Anesthesia Physical Anesthesia Plan  ASA: II  Anesthesia Plan: Epidural   Post-op Pain Management:    Induction:   PONV Risk Score and Plan:   Airway Management Planned:   Additional Equipment:   Intra-op Plan:   Post-operative Plan:   Informed Consent: I have reviewed the patients History and Physical, chart, labs and discussed the procedure including the risks, benefits and alternatives for the proposed anesthesia with the patient or authorized representative who has indicated his/her understanding and acceptance.     Plan Discussed with:   Anesthesia Plan Comments:         Anesthesia Quick Evaluation

## 2017-05-07 DIAGNOSIS — O99019 Anemia complicating pregnancy, unspecified trimester: Secondary | ICD-10-CM

## 2017-05-07 DIAGNOSIS — O0993 Supervision of high risk pregnancy, unspecified, third trimester: Secondary | ICD-10-CM

## 2017-05-07 DIAGNOSIS — O99820 Streptococcus B carrier state complicating pregnancy: Secondary | ICD-10-CM

## 2017-05-07 DIAGNOSIS — O4290 Premature rupture of membranes, unspecified as to length of time between rupture and onset of labor, unspecified weeks of gestation: Secondary | ICD-10-CM | POA: Diagnosis present

## 2017-05-07 DIAGNOSIS — Z8619 Personal history of other infectious and parasitic diseases: Secondary | ICD-10-CM | POA: Diagnosis present

## 2017-05-07 HISTORY — DX: Anemia complicating pregnancy, unspecified trimester: O99.019

## 2017-05-07 LAB — CBC
HCT: 31 % — ABNORMAL LOW (ref 35.0–47.0)
HEMOGLOBIN: 9.9 g/dL — AB (ref 12.0–16.0)
MCH: 25.3 pg — AB (ref 26.0–34.0)
MCHC: 32.1 g/dL (ref 32.0–36.0)
MCV: 78.9 fL — ABNORMAL LOW (ref 80.0–100.0)
PLATELETS: 186 10*3/uL (ref 150–440)
RBC: 3.92 MIL/uL (ref 3.80–5.20)
RDW: 16.4 % — ABNORMAL HIGH (ref 11.5–14.5)
WBC: 12.3 10*3/uL — AB (ref 3.6–11.0)

## 2017-05-07 LAB — RPR: RPR Ser Ql: NONREACTIVE

## 2017-05-07 MED ORDER — IBUPROFEN 600 MG PO TABS
600.0000 mg | ORAL_TABLET | Freq: Four times a day (QID) | ORAL | Status: DC
  Administered 2017-05-07 – 2017-05-08 (×6): 600 mg via ORAL
  Filled 2017-05-07 (×6): qty 1

## 2017-05-07 MED ORDER — DOCUSATE SODIUM 100 MG PO CAPS
100.0000 mg | ORAL_CAPSULE | Freq: Two times a day (BID) | ORAL | Status: DC
  Administered 2017-05-07: 100 mg via ORAL
  Filled 2017-05-07: qty 1

## 2017-05-07 MED ORDER — DIPHENHYDRAMINE HCL 25 MG PO CAPS: 25.0000 mg | ORAL_CAPSULE | Freq: Four times a day (QID) | ORAL | Status: DC | PRN

## 2017-05-07 MED ORDER — SIMETHICONE 80 MG PO CHEW
80.0000 mg | CHEWABLE_TABLET | ORAL | Status: DC | PRN
Start: 2017-05-07 — End: 2017-05-08

## 2017-05-07 MED ORDER — ACETAMINOPHEN 500 MG PO TABS
1000.0000 mg | ORAL_TABLET | Freq: Four times a day (QID) | ORAL | Status: DC | PRN
  Administered 2017-05-07: 1000 mg via ORAL
  Filled 2017-05-07: qty 2

## 2017-05-07 MED ORDER — DIBUCAINE 1 % RE OINT: 1.0000 "application " | TOPICAL_OINTMENT | RECTAL | Status: DC | PRN

## 2017-05-07 MED ORDER — BENZOCAINE-MENTHOL 20-0.5 % EX AERO
1.0000 "application " | INHALATION_SPRAY | CUTANEOUS | Status: DC | PRN
  Administered 2017-05-07: 1 via TOPICAL

## 2017-05-07 MED ORDER — ONDANSETRON HCL 4 MG/2ML IJ SOLN: 4.0000 mg | INTRAMUSCULAR | Status: DC | PRN

## 2017-05-07 MED ORDER — COCONUT OIL OIL
1.0000 "application " | TOPICAL_OIL | Status: DC | PRN
  Administered 2017-05-07 (×2): 1 via TOPICAL
  Filled 2017-05-07 (×2): qty 120

## 2017-05-07 MED ORDER — PRENATAL MULTIVITAMIN CH
1.0000 | ORAL_TABLET | Freq: Every day | ORAL | Status: DC
  Administered 2017-05-07: 1 via ORAL
  Filled 2017-05-07: qty 1

## 2017-05-07 MED ORDER — WITCH HAZEL-GLYCERIN EX PADS: 1.0000 "application " | MEDICATED_PAD | CUTANEOUS | Status: DC

## 2017-05-07 MED ORDER — ONDANSETRON HCL 4 MG PO TABS
4.0000 mg | ORAL_TABLET | ORAL | Status: DC | PRN
Start: 2017-05-07 — End: 2017-05-08

## 2017-05-07 NOTE — Clinical Social Work Note (Signed)
CSW received consult of MOB with positive UDS for cannabis. CSW will assess when able. CSW will contact CPS only if the infant has a positive UDS, meconium, or cord blood.   Santiago Bumpers, MSW, Latanya Presser [phone]

## 2017-05-07 NOTE — Discharge Summary (Signed)
Obstetrical Discharge Summary  Patient Name: Heidi Mata DOB: Oct 13, 1985 MRN: [id number]  Date of Admission: 05/06/2017 Date of Delivery: 05/07/17 Delivered by: Larey Days, MD Date of Discharge: 05/08/2017  Primary OB: [place]   HDQ:QIWLNLG'X last menstrual period was 08/01/2016. EDC Estimated Date of Delivery: 05/08/17 Gestational Age at Delivery: [redacted]w[redacted]d   Antepartum complications:  1. 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 GBS + 5. Anemia   Admitting Diagnosis:  High risk pregnancy, labor and delivery indication for care, premature rupture of membranes (rupture of membranes without labor)  Secondary Diagnosis: Patient Active Problem List   Diagnosis Date Noted  . GBS (group B Streptococcus carrier), +RV culture, currently pregnant 05/07/2017  . 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 History of herpes genitalis 05/07/2017  . Anemia affecting pregnancy 05/07/2017  . High-risk pregnancy, third trimester 05/07/2017  . Postpartum care following vaginal delivery 05/07/2017  . Premature rupture of membranes 05/07/2017  . Labor and delivery indication for care or intervention 05/06/2017    Augmentation: Pitocin and Cytotec Complications: None Intrapartum complications/course: Mom presented to L&D with premature rupture of membranes, induced with cytotec, augmented with pitocin.  epidual placed. Category 2 tracing with some variables and late decelerations.  Progressed to complete, second stage: <30 mins,  delivery of fetal head with restitution to ROT.   Anterior then posterior shoulders delivered without difficulty.  Baby placed on mom's chest, and attended to by peds. Cord was then clamped and cut when pulseless.  Placenta spontaneously delivered, intact.   IV pitocin given for hemorrhage prophylaxis.  Date of Delivery:  Delivered By: Vikki [place]s [facility] Delivery Type: spontaneous vaginal  delivery Anesthesia: epidural Placenta: sponatneous Laceration: none Episiotomy: none Newborn Data: Live born child  Birth Weight:  2800g, 6lb 2.8oz APGAR: 9, 9  Newborn Delivery   Birth date/time:  05/07/2017 00:06:00 Delivery type:  Vaginal, Spontaneous     Postpartum Procedures: none  Post partum course:  Patient had an uncomplicated postpartum course.  By time of discharge on PPD# 1, her pain was controlled on oral pain medications; she had appropriate lochia and was ambulating, voiding without difficulty and tolerating regular diet.  She was deemed stable for discharge to home.      Discharge Physical Exam:  BP 110/71 (BP Location: Left Arm)   Pulse 88   Temp 98.2 F (36.8 C) (Oral)   Resp 18   Ht 5\' 2"  (1.575 m)   Wt 83.5 kg (184 lb)   LMP 08/01/2016   SpO2 100%   Breastfeeding? Unknown   BMI 33.65 kg/m   General: NAD CV: RRR Pulm: CTABL, nl effort ABD: s/nd/nt, fundus firm and below the umbilicus Lochia: moderate DVT Evaluation: LE non-ttp, no evidence of DVT on exam.  Hemoglobin  Date Value Ref Range Status  05/07/2017 9.9 (L) 12.0 - 16.0 g/dL Final   HGB  Date Value Ref Range Status  08/02/2013 10.4 (L) 12.0 - 16.0 g/dL Final   HCT  Date Value Ref Range Status  05/07/2017 31.0 (L) 35.0 - 47.0 % Final  08/02/2013 33.1 (L) 35.0 - 47.0 % Final     Disposition: stable, discharge to home. Baby Feeding: breastmilk Baby Disposition: home with mom  Rh Immune globulin given: n/a Rubella vaccine given: n/a Tdap vaccine given in AP or PP setting: AP Flu vaccine given in AP or PP setting: AP  Contraception:  TBD, none for now  Prenatal  Labs:  Blood type/Rh O pos  Antibody screen neg  Rubella Immune  Varicella Immune  RPR NR  HBsAg Neg  HIV NR  GC neg  Chlamydia neg  Genetic screening Quad/ afp negative  1 hour GTT 65  3 hour GTT --  GBS Positive      Plan:  Heidi Mata was discharged to home in good condition. Follow-up  appointment with Dr. Leonides Schanz in 4 weeks.  Discharge Medications: Allergies as of 05/08/2017      Reactions   Lactose Intolerance (gi) Other (See Comments)   "makes my lungs bleed"   Sulfa Antibiotics Hives   Zoloft [sertraline Hcl] Other (See Comments)   Hallucinations   Flagyl [metronidazole] Nausea And Vomiting      Medication List    STOP taking these medications   doxycycline 100 MG capsule Commonly known as:  VIBRAMYCIN   metroNIDAZOLE 500 MG tablet Commonly known as:  FLAGYL   oxyCODONE-acetaminophen 5-325 MG tablet Commonly known as:  ROXICET   predniSONE 10 MG (21) Tbpk tablet Commonly known as:  STERAPRED UNI-PAK 21 TAB     TAKE these medications   acyclovir 800 MG tablet Commonly known as:  ZOVIRAX Take 1 tablet (800 mg total) by mouth 5 (five) times daily.   ferrous sulfate 325 (65 FE) MG tablet Take 325 mg by mouth daily with breakfast.   ibuprofen 800 MG tablet Commonly known as:  ADVIL,MOTRIN Take 1 tablet (800 mg total) by mouth every 8 (eight) hours as needed for moderate pain.   multivitamin-prenatal 27-0.8 MG Tabs tablet Take 1 tablet by mouth daily at 12 noon.   naproxen 500 MG tablet Commonly known as:  NAPROSYN Take 1 tablet (500 mg total) by mouth 2 (two) times daily with a meal.   ondansetron 4 MG disintegrating tablet Commonly known as:  ZOFRAN ODT Allow 1-2 tablets to dissolve in your mouth every 8 hours as needed for nausea/vomiting   ondansetron 4 MG disintegrating tablet Commonly known as:  ZOFRAN ODT Take 1 tablet (4 mg total) by mouth every 8 (eight) hours as needed for nausea or vomiting.   PRESCRIPTION MEDICATION as needed (for migraines).   RA VITAMIN A & D Oint Apply 1 application topically 3 (three) times daily.   tiZANidine 4 MG tablet Commonly known as:  ZANAFLEX Take 1 tablet (4 mg total) by mouth 3 (three) times daily.   trimethoprim-polymyxin b ophthalmic solution Commonly known as:  POLYTRIM Place 1 drop into the  left eye every 4 (four) hours. X 5 days         Signed: ----- Larey Days, MD Attending Obstetrician and Gynecologist [facility], Department of [facility]

## 2017-05-08 NOTE — Clinical Social Work Maternal (Signed)
CLINICAL SOCIAL WORK MATERNAL/CHILD NOTE  Patient Details  Name: Heidi Mata MRN: [id number] Date of Birth: 03-29-1986  Date:  05/08/2017  Clinical Social Worker Initiating Note:  Santiago Bumpers, MSW, LCSWA Date/Time: Initiated:  05/08/17/1005     Child's Name:      Biological Parents:  Mother   Need for Interpreter:  None   Reason for Referral:  Current Substance Use/Substance Use During Pregnancy    Address:  [place] [address]    Phone number:  [phone] (home)     Additional phone number: N/A  Household Members/Support Persons (HM/SP):   Household Member/Support Person 1, Household Member/Support Person 2   HM/SP Name Relationship DOB or Age  HM/SP -43 Chriss Driver Mother N/A  HM/SP -2 Not given Minor aged child 62  HM/SP -3        HM/SP -4        HM/SP -5        HM/SP -6        HM/SP -7        HM/SP -8          Natural Supports (not living in the home):  [place], Medical laboratory scientific officer, Extended Family, Friends, Armed forces technical officer, Spouse/significant other   Professional Supports:     Employment: Unemployed   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  Strengths:  Ability to meet basic needs , Compliance with medical plan , Home prepared for child , Understanding of illness   Psychotropic Medications:         Pediatrician:     The patient's 32YO attends [employer]; however, she is searching for a new pediatrician. She is considering Recruitment consultant [employer]. Her plan is to make a decision prior to first well-check.  Pediatrician List:   [facility]      Pediatrician Fax Number:    Risk Factors/Current Problems:  Substance Use    Cognitive State:  Alert , Able to Concentrate , Goal Oriented , Insightful , Linear Thinking    Mood/Affect:  Bright , Happy , Relaxed    CSW Assessment: The CSW met with the patient and her infant at bedside to discuss her positive UDS for cannabinoids. The patient admitted to use during pregnancy and prior to learning that she was pregnant. The patient indicated that she continued use due to pain secondary to sciatica, nausea, and low appetite. She stated that she discussed her use openly with her OB/GYN who had strongly cautioned the patient to discontinue use. The patient reported her last use as "around Christmas". The CSW explained the process of CPS referral being triggered not by the MOB's positive UDS, but by positive UDS, meconium, and/or cord blood readings for the infant. The patient verbalized understanding. The CSW advised the patient that the infant's UDS is negative; however, the cord blood results are pending. The patient verbalized understanding. Finally, the CSW provided psychoeducation related to postpartum depression/anxiety and substance use.  The CSW will  continue to monitor cord blood results and make a mandated report should the results be positive for any substances. Should the results return as negative, the CSW will sign off.  CSW Plan/Description:  CSW Will Continue to Monitor Umbilical Cord Tissue Drug Screen Results and Make Report if Frutoso Schatz, LCSW 05/08/2017, 10:08 AM

## 2017-05-08 NOTE — Progress Notes (Signed)
Discharged to home to car via auxillary 

## 2017-05-08 NOTE — Progress Notes (Signed)
D/c inst reviewed verb u/o of f/u appt

## 2017-05-09 NOTE — Anesthesia Postprocedure Evaluation (Signed)
Anesthesia Post Note  Patient: Heidi Mata  Procedure(s) Performed: AN AD HOC LABOR EPIDURAL  Anesthesia Type: Epidural Comments: Pt discharged prior to being seen     Last Vitals: There were no vitals filed for this visit.  Last Pain: There were no vitals filed for this visit.               KEPHART,WILLIAM K

## 2017-10-04 ENCOUNTER — Emergency Department
Admission: EM | Admit: 2017-10-04 | Discharge: 2017-10-04 | Disposition: A | Discharge status: Home / Self Care | Payer: Self-pay | Attending: Emergency Medicine | Admitting: Emergency Medicine

## 2017-10-04 ENCOUNTER — Other Ambulatory Visit: Payer: Self-pay

## 2017-10-04 ENCOUNTER — Emergency Department: Payer: Self-pay

## 2017-10-04 DIAGNOSIS — K5900 Constipation, unspecified: Secondary | ICD-10-CM | POA: Insufficient documentation

## 2017-10-04 DIAGNOSIS — K852 Alcohol induced acute pancreatitis without necrosis or infection: Secondary | ICD-10-CM | POA: Insufficient documentation

## 2017-10-04 LAB — COMPREHENSIVE METABOLIC PANEL
ALK PHOS: 65 U/L (ref 38–126)
ALT: 14 U/L (ref 14–54)
AST: 25 U/L (ref 15–41)
Albumin: 4.5 g/dL (ref 3.5–5.0)
Anion gap: 9 (ref 5–15)
BILIRUBIN TOTAL: 0.9 mg/dL (ref 0.3–1.2)
BUN: 9 mg/dL (ref 6–20)
CO2: 24 mmol/L (ref 22–32)
CREATININE: 0.88 mg/dL (ref 0.44–1.00)
Calcium: 9.8 mg/dL (ref 8.9–10.3)
Chloride: 103 mmol/L (ref 101–111)
GFR calc Af Amer: >60 mL/min (ref >60)
GLUCOSE: 100 mg/dL — AB (ref 65–99)
Potassium: 3.7 mmol/L (ref 3.5–5.1)
Sodium: 136 mmol/L (ref 135–145)
TOTAL PROTEIN: 8.9 g/dL — AB (ref 6.5–8.1)

## 2017-10-04 LAB — CBC WITH DIFFERENTIAL/PLATELET
BASOS ABS: 0 10*3/uL (ref 0–0.1)
Basophils Relative: 0 %
Eosinophils Absolute: 0.1 10*3/uL (ref 0–0.7)
Eosinophils Relative: 2 %
HEMATOCRIT: 37.8 % (ref 35.0–47.0)
HEMOGLOBIN: 12.3 g/dL (ref 12.0–16.0)
Lymphocytes Relative: 24 %
Lymphs Abs: 1.6 10*3/uL (ref 1.0–3.6)
MCH: 26.6 pg (ref 26.0–34.0)
MCHC: 32.4 g/dL (ref 32.0–36.0)
MCV: 82 fL (ref 80.0–100.0)
MONO ABS: 0.6 10*3/uL (ref 0.2–0.9)
Monocytes Relative: 9 %
NEUTROS ABS: 4.3 10*3/uL (ref 1.4–6.5)
NEUTROS PCT: 65 %
Platelets: 232 10*3/uL (ref 150–440)
RBC: 4.6 MIL/uL (ref 3.80–5.20)
RDW: 13.7 % (ref 11.5–14.5)
WBC: 6.6 10*3/uL (ref 3.6–11.0)

## 2017-10-04 LAB — URINALYSIS, COMPLETE (UACMP) WITH MICROSCOPIC
BACTERIA UA: NONE SEEN
Bilirubin Urine: NEGATIVE
Glucose, UA: NEGATIVE mg/dL
Hgb urine dipstick: NEGATIVE
KETONES UR: NEGATIVE mg/dL
Leukocytes, UA: NEGATIVE
Nitrite: NEGATIVE
PH: 6 (ref 5.0–8.0)
Protein, ur: 30 mg/dL — AB
SPECIFIC GRAVITY, URINE: 1.028 (ref 1.005–1.030)

## 2017-10-04 LAB — LIPASE, BLOOD: LIPASE: 163 U/L — AB (ref 11–51)

## 2017-10-04 LAB — TROPONIN I

## 2017-10-04 IMAGING — CR DG ABDOMEN 2V
1 series · 2 of 2 positions shown · non-contrast
Comparison: None.

CLINICAL DATA: Acute left-sided abdominal pain.

EXAM:
ABDOMEN - 2 VIEW

[Series 1: w abdomen upright · 0.14mm/px · 2 of 2 slices shown]
[im 1/2]
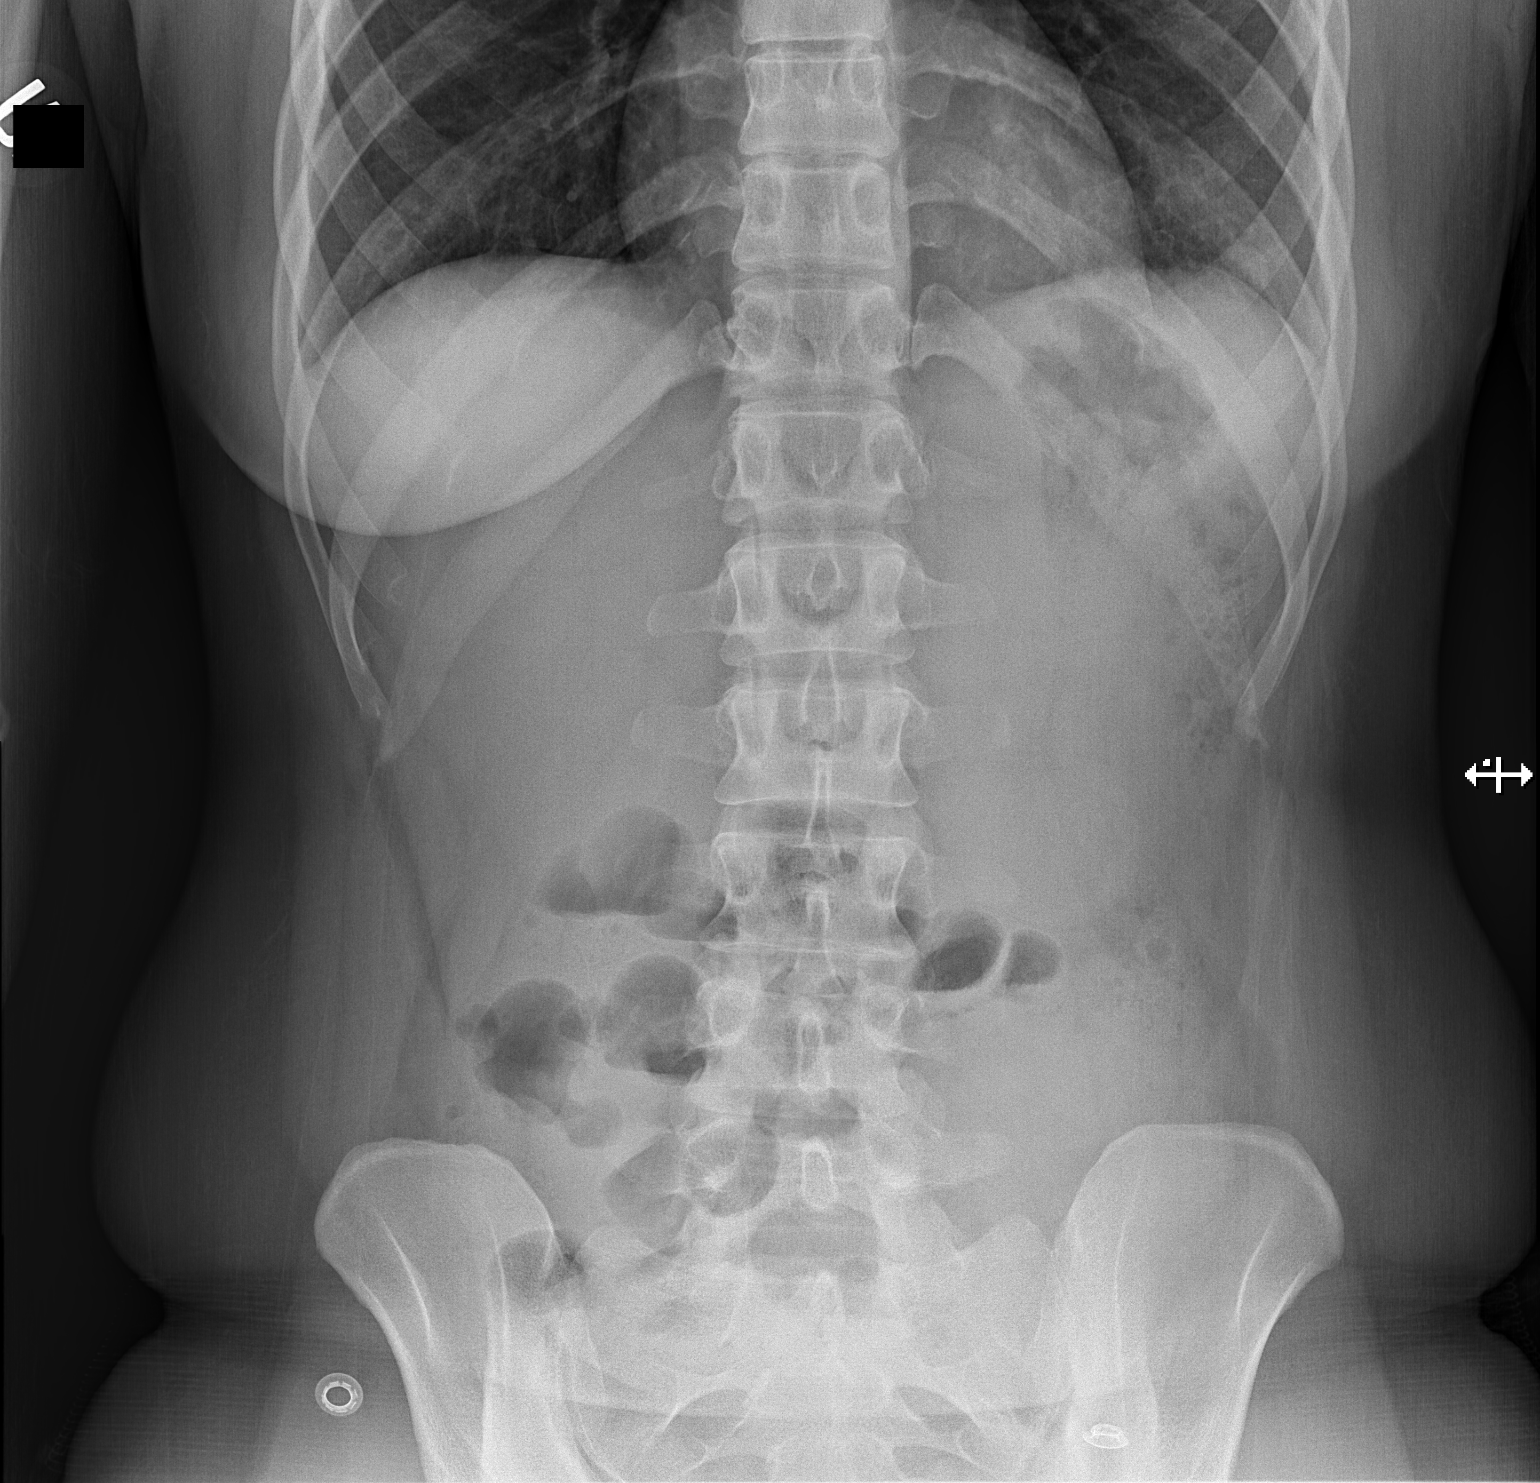
[im 2/2]
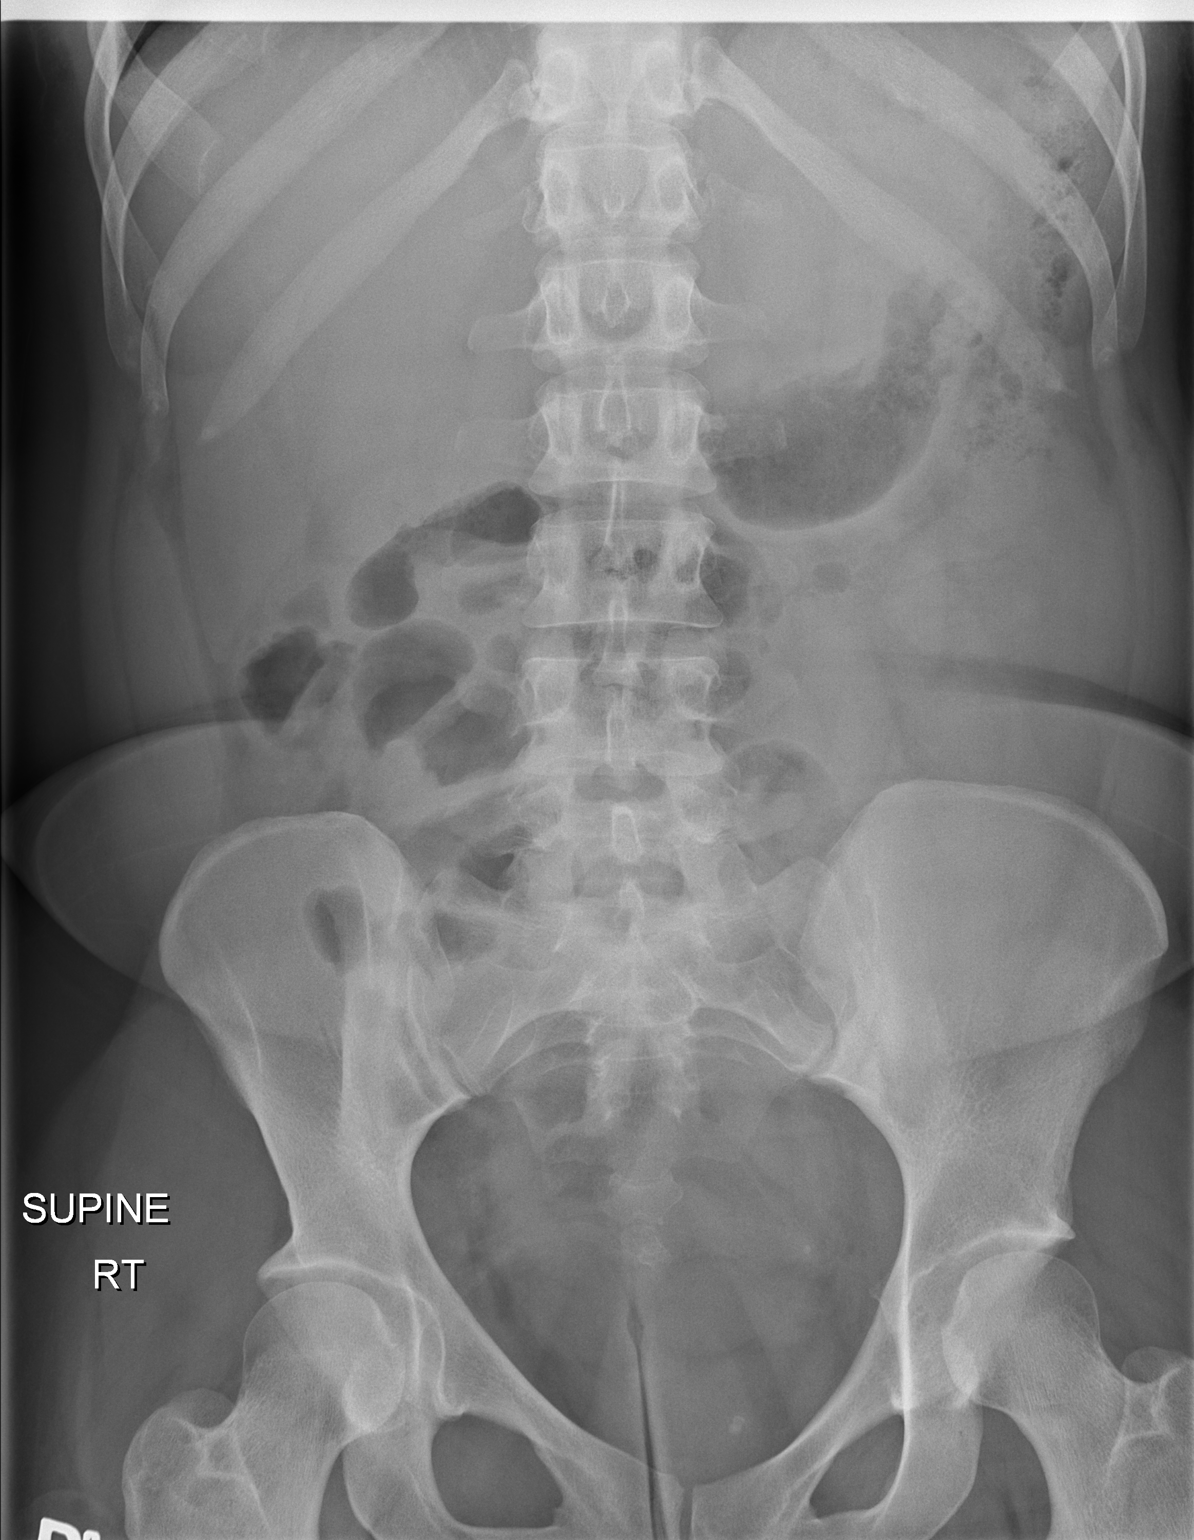

[2 of 2 positions shown; findings below may reference images not displayed]

FINDINGS: The bowel gas pattern is normal. There is no evidence of free air.
No radio-opaque calculi or other significant radiographic
abnormality is seen.
IMPRESSION: No evidence of bowel obstruction or ileus.

## 2017-10-04 MED ORDER — TRAMADOL HCL 50 MG PO TABS: 50.0000 mg | ORAL_TABLET | Freq: Two times a day (BID) | ORAL | 0 refills | Status: DC | PRN

## 2017-10-04 MED ORDER — IBUPROFEN 800 MG PO TABS
800.0000 mg | ORAL_TABLET | Freq: Once | ORAL | Status: AC
  Administered 2017-10-04: 800 mg via ORAL
  Filled 2017-10-04: qty 1

## 2017-10-04 MED ORDER — POLYETHYLENE GLYCOL 3350 17 G PO PACK: 17.0000 g | PACK | Freq: Every day | ORAL | 0 refills | Status: AC

## 2017-10-04 NOTE — ED Notes (Signed)
Pt c/o left side pain that started Thursday night - denies N/V/Diarrhea - last BM Saturday - off and on chills alternating with sweating - denies difficulty with urination

## 2017-10-04 NOTE — ED Triage Notes (Signed)
Pt c/o LUQ pain since Thursday. Denies N/V/D. States she has not had a regular BM. Pt is in NAD on arrival. Ambulatory to triage

## 2017-10-04 NOTE — ED Provider Notes (Signed)
[facility] Emergency Department Provider Note       Time seen: ----------------------------------------- 8:14 AM on 10/04/2017 -----------------------------------------   I have reviewed the triage vital signs and the nursing notes.  HISTORY   Chief Complaint Abdominal Pain    HPI Elexia Friedt Reicks is a 32 y.o. female with a history of anemia, anxiety, fibroids and migraines who presents to the ED for left upper quadrant pain since Thursday.  Patient states she has not had a regular bowel movement since Saturday.  Saturday she had a bowel movement which seemed to relieve the pain.  She denies fevers but has chills, denies vomiting.  She denies any dysuria, denies any vaginal discharge.  Past Medical History:  Diagnosis Date  . Anemia   . Anxiety   . Fibroid   . Hemosiderosis   . Migraines     Patient Active Problem List   Diagnosis Date Noted  . GBS (group B Streptococcus carrier), +RV culture, currently pregnant 05/07/2017  . 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 History of herpes genitalis 05/07/2017  . Anemia affecting pregnancy 05/07/2017  . High-risk pregnancy, third trimester 05/07/2017  . Postpartum care following vaginal delivery 05/07/2017  . Premature rupture of membranes 05/07/2017  . Labor and delivery indication for care or intervention 05/06/2017    Past Surgical History:  Procedure Laterality Date  . BREAST SURGERY     cyst removal, right  . CYST EXCISION    . ECTOPIC PREGNANCY SURGERY  2009  . LAPAROSCOPY FOR ECTOPIC PREGNANCY      Allergies Lactose intolerance (gi); Sulfa antibiotics; Zoloft [sertraline hcl]; and Flagyl [metronidazole]  Social History Social History   Tobacco Use  . Smoking status: Never Smoker  . Smokeless tobacco: Never Used  Substance Use Topics  . Alcohol use: No  . Drug use: No   Review of Systems Constitutional: Negative for fever. Cardiovascular: Negative for chest pain. Respiratory:  Negative for shortness of breath. Gastrointestinal: Positive for abdominal pain, constipation Genitourinary: Negative for dysuria. Musculoskeletal: Negative for back pain. Skin: Negative for rash. Neurological: Negative for headaches, focal weakness or numbness.  All systems negative/normal/unremarkable except as stated in the HPI  ____________________________________________   PHYSICAL EXAM:  VITAL SIGNS: ED Triage Vitals  Enc Vitals Group     BP 10/04/17 0812 131/70     Pulse Rate 10/04/17 0812 91     Resp 10/04/17 0812 17     Temp 10/04/17 0812 98.7 F (37.1 C)     Temp Source 10/04/17 0812 Oral     SpO2 10/04/17 0812 100 %     Weight 10/04/17 0809 150 lb (68 kg)     Height 10/04/17 0809 5\' 2"  (1.575 m)     Head Circumference --      Peak Flow --      Pain Score 10/04/17 0809 7     Pain Loc --      Pain Edu? --      Excl. in [place]? --    Constitutional: Alert and oriented. Well appearing and in no distress. Eyes: Conjunctivae are normal. Normal extraocular movements. ENT   Head: Normocephalic and atraumatic.   Nose: No congestion/rhinnorhea.   Mouth/Throat: Mucous membranes are moist.   Neck: No stridor. Cardiovascular: Normal rate, regular rhythm. No murmurs, rubs, or gallops. Respiratory: Normal respiratory effort without tachypnea nor retractions. Breath sounds are clear and equal bilaterally. No wheezes/rales/rhonchi. Gastrointestinal: Left upper quadrant tenderness, no rebound or guarding.  Normal bowel sounds. Musculoskeletal: Nontender  with normal range of motion in extremities. No lower extremity tenderness nor edema. Neurologic:  Normal speech and language. No gross focal neurologic deficits are appreciated.  Skin:  Skin is warm, dry and intact. No rash noted. Psychiatric: Mood and affect are normal. Speech and behavior are normal.  ____________________________________________  ED COURSE:  As part of my medical decision making, I reviewed the  following data within the [place] History obtained from family if available, nursing notes, old chart and ekg, as well as notes from prior ED visits. Patient presented for abdominal pain, we will assess with labs and imaging as indicated at this time.   Procedures ____________________________________________   LABS (pertinent positives/negatives)  Labs Reviewed  COMPREHENSIVE METABOLIC PANEL - Abnormal; Notable for the following components:      Result Value   Glucose, Bld 100 (*)    Total Protein 8.9 (*)    All other components within normal limits  LIPASE, BLOOD - Abnormal; Notable for the following components:   Lipase 163 (*)    All other components within normal limits  URINALYSIS, COMPLETE (UACMP) WITH MICROSCOPIC - Abnormal; Notable for the following components:   Color, Urine YELLOW (*)    APPearance HAZY (*)    Protein, ur 30 (*)    All other components within normal limits  CBC WITH DIFFERENTIAL/PLATELET  TROPONIN I  POC URINE PREG, ED    RADIOLOGY  Abdomen 2 view IMPRESSION: No evidence of bowel obstruction or ileus. ____________________________________________  DIFFERENTIAL DIAGNOSIS   Constipation, renal colic, UTI, pyelonephritis, muscle strain  FINAL ASSESSMENT AND PLAN  Left upper quadrant pain, pancreatitis  Plan: The patient had presented for left upper quadrant pain and possibly constipation. Patient's labs did reveal pancreatitis. Patient's imaging revealed some dehydration as well.  Patient states she has been drinking alcohol frequently due to being at home with 2 kids by herself.  I have advised this is likely the cause of her pancreatitis.  She also does appear constipated and will be discharged with MiraLAX.  She is cleared for outpatient follow-up.   Laurence Aly, MD   Note: This note was generated in part or whole with voice recognition software. Voice recognition is usually quite accurate but there are  transcription errors that can and very often do occur. I apologize for any typographical errors that were not detected and corrected.     Earleen Newport, MD 10/04/17 1030

## 2018-03-31 ENCOUNTER — Other Ambulatory Visit: Payer: Self-pay

## 2018-03-31 ENCOUNTER — Emergency Department
Admission: EM | Admit: 2018-03-31 | Discharge: 2018-03-31 | Disposition: A | Discharge status: Home / Self Care | Payer: Self-pay | Attending: Emergency Medicine | Admitting: Emergency Medicine

## 2018-03-31 ENCOUNTER — Encounter: Payer: Self-pay | Admitting: Emergency Medicine

## 2018-03-31 DIAGNOSIS — Z79899 Other long term (current) drug therapy: Secondary | ICD-10-CM | POA: Insufficient documentation

## 2018-03-31 DIAGNOSIS — B029 Zoster without complications: Secondary | ICD-10-CM | POA: Insufficient documentation

## 2018-03-31 MED ORDER — LIDOCAINE 5 % EX PTCH
1.0000 | MEDICATED_PATCH | CUTANEOUS | Status: DC
  Administered 2018-03-31: 1 via TRANSDERMAL
  Filled 2018-03-31: qty 1

## 2018-03-31 MED ORDER — OXYCODONE-ACETAMINOPHEN 7.5-325 MG PO TABS: 1.0000 | ORAL_TABLET | Freq: Four times a day (QID) | ORAL | 0 refills | Status: DC | PRN

## 2018-03-31 MED ORDER — ACYCLOVIR 400 MG PO TABS: 400.0000 mg | ORAL_TABLET | Freq: Every day | ORAL | 0 refills | Status: DC

## 2018-03-31 NOTE — ED Triage Notes (Addendum)
Pt has rash to left buttocks that appears like shingles.  Pt c/o pain down entire left leg that is sensitive that burns/hurts if touch leg at all.  Pain would seem to follow same dermatone of where rash is. Has had chicken pox.

## 2018-03-31 NOTE — ED Provider Notes (Signed)
[facility] Emergency Department Provider Note   ____________________________________________   First MD Initiated Contact with Patient 03/31/18 1720     (approximate)  I have reviewed the triage vital signs and the nursing notes.   HISTORY  Chief Complaint Rash    HPI Heidi Mata is a 32 y.o. female patient presents with her burning rash to the left buttocks for 3 days.  Patient area is sensitive to touch and increased pain when sitting down.  No fever/chills associated with complaint.  Patient rates the pain as 8/10.  No palliative measures for complaint.   Past Medical History:  Diagnosis Date  . Anemia   . Anxiety   . Fibroid   . Hemosiderosis   . Migraines     Patient Active Problem List   Diagnosis Date Noted  . GBS (group B Streptococcus carrier), +RV culture, currently pregnant 05/07/2017  . 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 History of herpes genitalis 05/07/2017  . Anemia affecting pregnancy 05/07/2017  . High-risk pregnancy, third trimester 05/07/2017  . Postpartum care following vaginal delivery 05/07/2017  . Premature rupture of membranes 05/07/2017  . Labor and delivery indication for care or intervention 05/06/2017    Past Surgical History:  Procedure Laterality Date  . BREAST SURGERY     cyst removal, right  . CYST EXCISION    . ECTOPIC PREGNANCY SURGERY  2009  . LAPAROSCOPY FOR ECTOPIC PREGNANCY      Prior to Admission medications   Medication Sig Start Date End Date Taking? Authorizing Provider  acyclovir (ZOVIRAX) 400 MG tablet Take 1 tablet (400 mg total) by mouth 5 (five) times daily. 03/31/18   Sable Feil, PA-C  ferrous sulfate 325 (65 FE) MG tablet Take 325 mg by mouth every Wednesday.     [provider]  ibuprofen (ADVIL,MOTRIN) 800 MG tablet Take 1 tablet (800 mg total) by mouth every 8 (eight) hours as needed for moderate pain. Patient not taking: Reported on 02/06/2017 12/10/15   Paulette Blanch, MD  naproxen (NAPROSYN) 500 MG tablet Take 1 tablet (500 mg total) by mouth 2 (two) times daily with a meal. Patient not taking: Reported on 02/06/2017 08/07/16   Victorino Dike, FNP  ondansetron (ZOFRAN ODT) 4 MG disintegrating tablet Allow 1-2 tablets to dissolve in your mouth every 8 hours as needed for nausea/vomiting Patient not taking: Reported on 05/06/2017 03/21/16   Hinda Kehr, MD  ondansetron (ZOFRAN ODT) 4 MG disintegrating tablet Take 1 tablet (4 mg total) by mouth every 8 (eight) hours as needed for nausea or vomiting. Patient not taking: Reported on 05/06/2017 10/18/16   Earleen Newport, MD  oxyCODONE-acetaminophen (PERCOCET) 7.5-325 MG tablet Take 1 tablet by mouth every 6 (six) hours as needed. 03/31/18   Sable Feil, PA-C  polyethylene glycol [facility] / GLYCOLAX) packet Take 17 g by mouth daily. 10/04/17   Earleen Newport, MD  tiZANidine (ZANAFLEX) 4 MG tablet Take 1 tablet (4 mg total) by mouth 3 (three) times daily. Patient not taking: Reported on 10/04/2017 08/07/16   Sherrie George B, FNP  traMADol (ULTRAM) 50 MG tablet Take 1 tablet (50 mg total) by mouth every 12 (twelve) hours as needed. 10/04/17 10/04/18  Earleen Newport, MD  valACYclovir (VALTREX) 500 MG tablet Take 500 mg by mouth 2 (two) times daily as needed (outbreaks).  06/28/17   [provider]    Allergies Lactose intolerance (gi); Sulfa antibiotics; Zoloft [sertraline hcl]; and Flagyl [  metronidazole]  History reviewed. No pertinent family history.  Social History Social History   Tobacco Use  . Smoking status: Never Smoker  . Smokeless tobacco: Never Used  Substance Use Topics  . Alcohol use: No  . Drug use: No    Review of Systems Constitutional: No fever/chills Eyes: No visual changes. ENT: No sore throat. Cardiovascular: Denies chest pain. Respiratory: Denies shortness of breath. Gastrointestinal: No abdominal pain.  No nausea, no vomiting.  No diarrhea.  No  constipation. Genitourinary: Negative for dysuria. Musculoskeletal: Negative for back pain. Skin: Positive for rash. Neurological: Negative for headaches, focal weakness or numbness. Psychiatric:Anxiety Allergic/Immunilogical: See medication list.  ____________________________________________   PHYSICAL EXAM:  VITAL SIGNS: ED Triage Vitals  Enc Vitals Group     BP 03/31/18 1655 110/67     Pulse Rate 03/31/18 1655 77     Resp 03/31/18 1655 18     Temp 03/31/18 1655 98.3 F (36.8 C)     Temp Source 03/31/18 1655 Oral     SpO2 03/31/18 1655 100 %     Weight 03/31/18 1709 130 lb (59 kg)     Height 03/31/18 1709 5\' 2"  (1.575 m)     Head Circumference --      Peak Flow --      Pain Score 03/31/18 1709 8     Pain Loc --      Pain Edu? --      Excl. in [place]? --    Constitutional: Alert and oriented. Well appearing and in no acute distress. Cardiovascular: Normal rate, regular rhythm. Grossly normal heart sounds.  Good peripheral circulation. Respiratory: Normal respiratory effort.  No retractions. Lungs CTAB. Neurologic:  Normal speech and language. No gross focal neurologic deficits are appreciated. No gait instability. Skin:  Skin is warm, dry and intact.  Vesicle lesion left buttocks.   Psychiatric: Mood and affect are normal. Speech and behavior are normal.  ____________________________________________   LABS (all labs ordered are listed, but only abnormal results are displayed)  Labs Reviewed - No data to display ____________________________________________  EKG   ____________________________________________  RADIOLOGY  ED MD interpretation:    Official radiology report(s): No results found.  ____________________________________________   PROCEDURES  Procedure(s) performed: None  Procedures  Critical Care performed: No  ____________________________________________   INITIAL IMPRESSION / ASSESSMENT AND PLAN / ED COURSE  As part of my medical  decision making, I reviewed the following data within the [place]    Patient presented for burning rash to the left buttocks.  Lesions are consistent with herpes zoster.  Lidoderm patch was applied.  Patient given discharge care instructions and a prescription for acyclovir and Percocets.  [facility]      ____________________________________________   FINAL CLINICAL IMPRESSION(S) / ED DIAGNOSES  Final diagnoses:  Herpes zoster without complication     ED Discharge Orders         Ordered    acyclovir (ZOVIRAX) 400 MG tablet  5 times daily     03/31/18 1741    oxyCODONE-acetaminophen (PERCOCET) 7.5-325 MG tablet  Every 6 hours PRN     03/31/18 1741           Note:  This document was prepared using Dragon voice recognition software and may include unintentional dictation errors.    Sable Feil, PA-C 03/31/18 1750    Delman Kitten, MD 03/31/18 2130

## 2018-03-31 NOTE — ED Notes (Signed)
See triage note  Presents with pain to lower back into buttocks and left leg  States she also noticed a small area of rash  States area is very painful

## 2018-04-05 ENCOUNTER — Encounter: Payer: Self-pay | Admitting: Emergency Medicine

## 2018-04-05 ENCOUNTER — Emergency Department
Admission: EM | Admit: 2018-04-05 | Discharge: 2018-04-05 | Disposition: A | Discharge status: Home / Self Care | Payer: Self-pay | Attending: Emergency Medicine | Admitting: Emergency Medicine

## 2018-04-05 DIAGNOSIS — B9789 Other viral agents as the cause of diseases classified elsewhere: Secondary | ICD-10-CM | POA: Insufficient documentation

## 2018-04-05 DIAGNOSIS — R0981 Nasal congestion: Secondary | ICD-10-CM | POA: Insufficient documentation

## 2018-04-05 DIAGNOSIS — Z79899 Other long term (current) drug therapy: Secondary | ICD-10-CM | POA: Insufficient documentation

## 2018-04-05 DIAGNOSIS — J069 Acute upper respiratory infection, unspecified: Secondary | ICD-10-CM | POA: Insufficient documentation

## 2018-04-05 MED ORDER — BENZONATATE 100 MG PO CAPS: 200.0000 mg | ORAL_CAPSULE | Freq: Three times a day (TID) | ORAL | 0 refills | Status: DC | PRN

## 2018-04-05 MED ORDER — IBUPROFEN 600 MG PO TABS: 600.0000 mg | ORAL_TABLET | Freq: Three times a day (TID) | ORAL | 0 refills | Status: DC | PRN

## 2018-04-05 MED ORDER — FEXOFENADINE-PSEUDOEPHED ER 60-120 MG PO TB12: 1.0000 | ORAL_TABLET | Freq: Two times a day (BID) | ORAL | 0 refills | Status: DC

## 2018-04-05 NOTE — ED Triage Notes (Signed)
Pt c.o face pain, headache, nasal congestion, cough and not feeling well for the past 2 days.

## 2018-04-05 NOTE — ED Notes (Signed)
Pt reports for the past 2 days she has been sneezing and her face has bene hurting and her head and throat. Pt also reports a small cough but states that her nose has been stuffy and runny as well. Denies difficulty breathing breathing or other concerns.

## 2018-04-05 NOTE — ED Provider Notes (Addendum)
[facility] Emergency Department Provider Note   ____________________________________________   First MD Initiated Contact with Patient 04/05/18 0940     (approximate)  I have reviewed the triage vital signs and the nursing notes.   HISTORY  Chief Complaint Facial Pain; Nasal Congestion; Headache; and Cough    HPI Heidi Mata is a 32 y.o. female patient complain of headache, nasal congestion, cough, and not feeling well for 2 days.  Patient denies nausea, vomiting, diarrhea.  Patient not taken flu shot for this season.  No palliative measure for complaint.  Mother states children are also sick with same complaints.   Past Medical History:  Diagnosis Date  . Anemia   . Anxiety   . Fibroid   . Hemosiderosis   . Migraines     Patient Active Problem List   Diagnosis Date Noted  . GBS (group B Streptococcus carrier), +RV culture, currently pregnant 05/07/2017  . 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 History of herpes genitalis 05/07/2017  . Anemia affecting pregnancy 05/07/2017  . High-risk pregnancy, third trimester 05/07/2017  . Postpartum care following vaginal delivery 05/07/2017  . Premature rupture of membranes 05/07/2017  . Labor and delivery indication for care or intervention 05/06/2017    Past Surgical History:  Procedure Laterality Date  . BREAST SURGERY     cyst removal, right  . CYST EXCISION    . ECTOPIC PREGNANCY SURGERY  2009  . LAPAROSCOPY FOR ECTOPIC PREGNANCY      Prior to Admission medications   Medication Sig Start Date End Date Taking? Authorizing Provider  acyclovir (ZOVIRAX) 400 MG tablet Take 1 tablet (400 mg total) by mouth 5 (five) times daily. 03/31/18   Sable Feil, PA-C  benzonatate (TESSALON PERLES) 100 MG capsule Take 2 capsules (200 mg total) by mouth 3 (three) times daily as needed. 04/05/18 04/05/19  Sable Feil, PA-C  ferrous sulfate 325 (65 FE) MG tablet Take 325 mg by mouth every Wednesday.      [provider]  fexofenadine-pseudoephedrine (ALLEGRA-D) 60-120 MG 12 hr tablet Take 1 tablet by mouth 2 (two) times daily. 04/05/18   Sable Feil, PA-C  ibuprofen (ADVIL,MOTRIN) 600 MG tablet Take 1 tablet (600 mg total) by mouth every 8 (eight) hours as needed. 04/05/18   Sable Feil, PA-C  ibuprofen (ADVIL,MOTRIN) 800 MG tablet Take 1 tablet (800 mg total) by mouth every 8 (eight) hours as needed for moderate pain. Patient not taking: Reported on 02/06/2017 12/10/15   Paulette Blanch, MD  naproxen (NAPROSYN) 500 MG tablet Take 1 tablet (500 mg total) by mouth 2 (two) times daily with a meal. Patient not taking: Reported on 02/06/2017 08/07/16   Victorino Dike, FNP  ondansetron (ZOFRAN ODT) 4 MG disintegrating tablet Allow 1-2 tablets to dissolve in your mouth every 8 hours as needed for nausea/vomiting Patient not taking: Reported on 05/06/2017 03/21/16   Hinda Kehr, MD  ondansetron (ZOFRAN ODT) 4 MG disintegrating tablet Take 1 tablet (4 mg total) by mouth every 8 (eight) hours as needed for nausea or vomiting. Patient not taking: Reported on 05/06/2017 10/18/16   Earleen Newport, MD  oxyCODONE-acetaminophen (PERCOCET) 7.5-325 MG tablet Take 1 tablet by mouth every 6 (six) hours as needed. 03/31/18   Sable Feil, PA-C  polyethylene glycol [facility] / GLYCOLAX) packet Take 17 g by mouth daily. 10/04/17   Earleen Newport, MD  tiZANidine (ZANAFLEX) 4 MG tablet Take 1 tablet (4 mg total)  by mouth 3 (three) times daily. Patient not taking: Reported on 10/04/2017 08/07/16   Sherrie George B, FNP  traMADol (ULTRAM) 50 MG tablet Take 1 tablet (50 mg total) by mouth every 12 (twelve) hours as needed. 10/04/17 10/04/18  Earleen Newport, MD  valACYclovir (VALTREX) 500 MG tablet Take 500 mg by mouth 2 (two) times daily as needed (outbreaks).  06/28/17   [provider]    Allergies Lactose intolerance (gi); Sulfa antibiotics; Zoloft [sertraline hcl]; and Flagyl  [metronidazole]  No family history on file.  Social History Social History   Tobacco Use  . Smoking status: Never Smoker  . Smokeless tobacco: Never Used  Substance Use Topics  . Alcohol use: No  . Drug use: No    Review of Systems Constitutional: No fever/chills.  Body aches Eyes: No visual changes. ENT: Nasal congestion. Cardiovascular: Denies chest pain. Respiratory: Denies shortness of breath. Gastrointestinal: No abdominal pain.  No nausea, no vomiting.  No diarrhea.  No constipation. Genitourinary: Negative for dysuria. Musculoskeletal: Negative for back pain. Skin: Negative for rash. Neurological: Positive for headaches, but denies focal weakness or numbness. Endocrine:Anxiety Hematological/Lymphatic: Allergic/Immunilogical: See medication list.  ____________________________________________   PHYSICAL EXAM:  VITAL SIGNS: ED Triage Vitals  Enc Vitals Group     BP 04/05/18 0903 118/78     Pulse Rate 04/05/18 0903 83     Resp 04/05/18 0903 18     Temp 04/05/18 0903 98.2 F (36.8 C)     Temp Source 04/05/18 0903 Oral     SpO2 04/05/18 0903 99 %     Weight 04/05/18 0902 130 lb (59 kg)     Height 04/05/18 0902 5\' 2"  (1.575 m)     Head Circumference --      Peak Flow --      Pain Score 04/05/18 0901 5     Pain Loc --      Pain Edu? --      Excl. in [place]? --    Constitutional: Alert and oriented. Well appearing and in no acute distress. Nose: Bilateral maxillary guarding.  Edematous nasal turbinates.  Clear rhinorrhea. Mouth/Throat: Mucous membranes are moist.  Oropharynx non-erythematous.  Postnasal drainage. Neck: No stridor. Hematological/Lymphatic/Immunilogical: No cervical lymphadenopathy. Cardiovascular: Normal rate, regular rhythm. Grossly normal heart sounds.  Good peripheral circulation. Respiratory: Normal respiratory effort.  No retractions. Lungs CTAB. Musculoskeletal: No lower extremity tenderness nor edema.  No joint effusions. Neurologic:   Normal speech and language. No gross focal neurologic deficits are appreciated. No gait instability. Skin:  Skin is warm, dry and intact. No rash noted. Psychiatric: Mood and affect are normal. Speech and behavior are normal.  ____________________________________________   LABS (all labs ordered are listed, but only abnormal results are displayed)  Labs Reviewed - No data to display ____________________________________________  EKG   ____________________________________________  RADIOLOGY  ED MD interpretation:    Official radiology report(s): No results found.  ____________________________________________   PROCEDURES  Procedure(s) performed: None  Procedures  Critical Care performed: No  ____________________________________________   INITIAL IMPRESSION / ASSESSMENT AND PLAN / ED COURSE  As part of my medical decision making, I reviewed the following data within the [place]    Patient presents with facial pain, headache and nasal congestion.  Patient also has nonproductive cough.  Physical exam consistent with viral infection.  Patient was given discharge care instruction advised take medication as directed.      ____________________________________________   FINAL CLINICAL IMPRESSION(S) / ED DIAGNOSES  Final diagnoses:  Viral URI with cough  Nasal congestion     ED Discharge Orders         Ordered    fexofenadine-pseudoephedrine (ALLEGRA-D) 60-120 MG 12 hr tablet  2 times daily     04/05/18 1010    benzonatate (TESSALON PERLES) 100 MG capsule  3 times daily PRN     04/05/18 1010    ibuprofen (ADVIL,MOTRIN) 600 MG tablet  Every 8 hours PRN     04/05/18 1010           Note:  This document was prepared using Dragon voice recognition software and may include unintentional dictation errors.    Sable Feil, PA-C 04/05/18 1011    Sable Feil, PA-C 04/05/18 1040    Drenda Freeze, MD 04/05/18 [phone]

## 2018-04-05 NOTE — ED Notes (Signed)
First Nurse Note: Mother with 2 children.  Complaining of cough.  Given adult face mask to wear.

## 2018-04-05 NOTE — ED Notes (Signed)
Pt verbalizes understanding of d/c instructions, and follow up 

## 2018-04-26 ENCOUNTER — Encounter: Payer: Self-pay | Admitting: Emergency Medicine

## 2018-04-26 ENCOUNTER — Emergency Department
Admission: EM | Admit: 2018-04-26 | Discharge: 2018-04-26 | Disposition: A | Discharge status: Home / Self Care | Payer: Self-pay | Attending: Emergency Medicine | Admitting: Emergency Medicine

## 2018-04-26 DIAGNOSIS — Z79899 Other long term (current) drug therapy: Secondary | ICD-10-CM | POA: Insufficient documentation

## 2018-04-26 DIAGNOSIS — J069 Acute upper respiratory infection, unspecified: Secondary | ICD-10-CM | POA: Insufficient documentation

## 2018-04-26 DIAGNOSIS — Z209 Contact with and (suspected) exposure to unspecified communicable disease: Secondary | ICD-10-CM | POA: Insufficient documentation

## 2018-04-26 DIAGNOSIS — B9789 Other viral agents as the cause of diseases classified elsewhere: Secondary | ICD-10-CM | POA: Insufficient documentation

## 2018-04-26 LAB — INFLUENZA PANEL BY PCR (TYPE A & B)
INFLAPCR: NEGATIVE
Influenza B By PCR: NEGATIVE

## 2018-04-26 MED ORDER — GUAIFENESIN-CODEINE 100-10 MG/5ML PO SOLN: 5.0000 mL | Freq: Four times a day (QID) | ORAL | 0 refills | Status: AC | PRN

## 2018-04-26 NOTE — ED Notes (Signed)
Pt is being discharged to home. Pt is AOx4, VSS and she does not appear to be in distress and c/o mild pain at this time. AVS was given and explained to the pt and she verbalized understanding of the discharge plan of care.

## 2018-04-26 NOTE — ED Notes (Signed)
Patient given orange and apple juice

## 2018-04-26 NOTE — ED Triage Notes (Signed)
Pt states congestion, cough, headache for a day now. Productive yellow sputum. NAD.

## 2018-04-26 NOTE — Discharge Instructions (Addendum)
Follow-up with your primary care provider if any continued problems.  Increase fluids.  Tylenol or ibuprofen as needed for fever or body aches.  Take Robitussin-AC as needed for cough and congestion.  Do not drive or operate machinery while taking this medication as it contains a narcotic and could cause drowsiness.

## 2018-04-26 NOTE — ED Notes (Signed)
Patient c/o flu like symptoms of cough, runny nose, chills/sweats and headache Xcouple days

## 2018-04-26 NOTE — ED Provider Notes (Signed)
[facility] Emergency Department Provider Note  ____________________________________________   First MD Initiated Contact with Patient 04/26/18 1137     (approximate)  I have reviewed the triage vital signs and the nursing notes.   HISTORY  Chief Complaint Influenza   HPI Heidi Mata is a 33 y.o. female presents to the ED with complaint of several days of body aches, cough, congestion and subjective fever.  Patient has had nausea but is been drinking fluids.  She states that her small children were diagnosed week ago with URI.  She is not aware of any influenza contact.   Past Medical History:  Diagnosis Date  . Anemia   . Anxiety   . Fibroid   . Hemosiderosis   . Migraines     Patient Active Problem List   Diagnosis Date Noted  . GBS (group B Streptococcus carrier), +RV culture, currently pregnant 05/07/2017  . 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 History of herpes genitalis 05/07/2017  . Anemia affecting pregnancy 05/07/2017  . High-risk pregnancy, third trimester 05/07/2017  . Postpartum care following vaginal delivery 05/07/2017  . Premature rupture of membranes 05/07/2017  . Labor and delivery indication for care or intervention 05/06/2017    Past Surgical History:  Procedure Laterality Date  . BREAST SURGERY     cyst removal, right  . CYST EXCISION    . ECTOPIC PREGNANCY SURGERY  2009  . LAPAROSCOPY FOR ECTOPIC PREGNANCY      Prior to Admission medications   Medication Sig Start Date End Date Taking? Authorizing Provider  ferrous sulfate 325 (65 FE) MG tablet Take 325 mg by mouth every Wednesday.     [provider]  guaiFENesin-codeine 100-10 MG/5ML syrup Take 5 mLs by mouth every 6 (six) hours as needed. 04/26/18   Johnn Hai, PA-C  polyethylene glycol (MIRALAX / GLYCOLAX) packet Take 17 g by mouth daily. 10/04/17   Earleen Newport, MD  valACYclovir (VALTREX) 500 MG tablet Take 500 mg by mouth 2 (two) times  daily as needed (outbreaks).  06/28/17   [provider]    Allergies Lactose intolerance (gi); Sulfa antibiotics; Zoloft [sertraline hcl]; and Flagyl [metronidazole]  No family history on file.  Social History Social History   Tobacco Use  . Smoking status: Never Smoker  . Smokeless tobacco: Never Used  Substance Use Topics  . Alcohol use: No  . Drug use: No    Review of Systems Constitutional: No fever/chills Eyes: No visual changes. ENT: Positive for nasal congestion. Cardiovascular: Denies chest pain. Respiratory: Denies shortness of breath. Gastrointestinal: No abdominal pain.  Positive nausea, no vomiting.  No diarrhea.  Genitourinary: Negative for dysuria. Musculoskeletal: Negative for back pain. Skin: Negative for rash. Neurological: Negative for headaches, focal weakness or numbness. ____________________________________________   PHYSICAL EXAM:  VITAL SIGNS: ED Triage Vitals  Enc Vitals Group     BP --      Pulse Rate 04/26/18 1041 84     Resp 04/26/18 1041 18     Temp 04/26/18 1041 98.4 F (36.9 C)     Temp Source 04/26/18 1041 Oral     SpO2 04/26/18 1041 99 %     Weight 04/26/18 1042 130 lb (59 kg)     Height 04/26/18 1042 5\' 2"  (1.575 m)     Head Circumference --      Peak Flow --      Pain Score 04/26/18 1042 8     Pain Loc --  Pain Edu? --      Excl. in [place]? --    Constitutional: Alert and oriented. Well appearing and in no acute distress. Eyes: Conjunctivae are normal. PERRL. EOMI. Head: Atraumatic. Nose: Mild congestion/rhinnorhea.  EACs and TMs are clear bilaterally. Mouth/Throat: Mucous membranes are moist.  Oropharynx non-erythematous. Neck: No stridor.   Hematological/Lymphatic/Immunilogical: No cervical lymphadenopathy. Cardiovascular: Normal rate, regular rhythm. Grossly normal heart sounds.  Good peripheral circulation. Respiratory: Normal respiratory effort.  No retractions. Lungs CTAB. Gastrointestinal: Soft and  nontender. No distention. No abdominal bruits. No CVA tenderness. Musculoskeletal: No lower extremity tenderness nor edema.  No joint effusions. Neurologic:  Normal speech and language. No gross focal neurologic deficits are appreciated. No gait instability. Skin:  Skin is warm, dry and intact. No rash noted. Psychiatric: Mood and affect are normal. Speech and behavior are normal.  ____________________________________________   LABS (all labs ordered are listed, but only abnormal results are displayed)  Labs Reviewed  INFLUENZA PANEL BY PCR (TYPE A & B)     PROCEDURES  Procedure(s) performed: None  Procedures  Critical Care performed: No  ____________________________________________   INITIAL IMPRESSION / ASSESSMENT AND PLAN / ED COURSE  As part of my medical decision making, I reviewed the following data within the electronic MEDICAL RECORD NUMBER Notes from prior ED visits and [place] Controlled Substance Database  Patient presents to the ED with several days of URI symptoms.  She is tried over-the-counter cough medication without any relief.  She has small children at home who have had the same.  Patient clinically has a viral URI.  She is made aware that her fluency test is negative.  Patient was given a prescription for Robitussin-AC as needed every 6 hours.  She is to increase fluids.  Take Tylenol or ibuprofen as needed for body aches or fever.  She is to follow-up with her PCP if any continued problems.   ____________________________________________   FINAL CLINICAL IMPRESSION(S) / ED DIAGNOSES  Final diagnoses:  Viral URI with cough     ED Discharge Orders         Ordered    guaiFENesin-codeine 100-10 MG/5ML syrup  Every 6 hours PRN     04/26/18 1245           Note:  This document was prepared using Dragon voice recognition software and may include unintentional dictation errors.    Johnn Hai, PA-C 04/26/18 1307    Earleen Newport, MD 04/26/18  [phone]

## 2018-11-22 ENCOUNTER — Other Ambulatory Visit: Payer: Self-pay

## 2018-11-22 ENCOUNTER — Ambulatory Visit (LOCAL_COMMUNITY_HEALTH_CENTER): Payer: Self-pay

## 2018-11-22 DIAGNOSIS — F419 Anxiety disorder, unspecified: Secondary | ICD-10-CM

## 2018-11-22 DIAGNOSIS — N760 Acute vaginitis: Secondary | ICD-10-CM

## 2018-11-22 DIAGNOSIS — B9689 Other specified bacterial agents as the cause of diseases classified elsewhere: Secondary | ICD-10-CM

## 2018-11-22 DIAGNOSIS — Z8619 Personal history of other infectious and parasitic diseases: Secondary | ICD-10-CM

## 2018-11-22 DIAGNOSIS — F329 Major depressive disorder, single episode, unspecified: Secondary | ICD-10-CM

## 2018-11-22 DIAGNOSIS — Z113 Encounter for screening for infections with a predominantly sexual mode of transmission: Secondary | ICD-10-CM

## 2018-11-22 LAB — WET PREP FOR TRICH, YEAST, CLUE
Trichomonas Exam: NEGATIVE
Yeast Exam: NEGATIVE

## 2018-11-22 MED ORDER — CLINDAMYCIN PHOSPHATE 2 % VA CREA: 1.0000 | TOPICAL_CREAM | Freq: Every day | VAGINAL | Status: DC

## 2018-11-22 MED ORDER — VALACYCLOVIR HCL 1 G PO TABS: 1000.0000 mg | ORAL_TABLET | Freq: Two times a day (BID) | ORAL | 0 refills | Status: DC

## 2018-11-22 NOTE — Progress Notes (Signed)
    STI clinic/screening visit  Subjective:  Heidi Mata is a 33 y.o. female being seen today for an STI screening visit. The patient reports they do have symptoms.  Patient has the following medical conditions:   Patient Active Problem List   Diagnosis Date Noted  . GBS (group B Streptococcus carrier), +RV culture, currently pregnant 05/07/2017  . 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 History of herpes genitalis 05/07/2017  . Anemia affecting pregnancy 05/07/2017  . High-risk pregnancy, third trimester 05/07/2017  . Postpartum care following vaginal delivery 05/07/2017  . Premature rupture of membranes 05/07/2017  . Labor and delivery indication for care or intervention 05/06/2017     Chief Complaint  Patient presents with  . SEXUALLY TRANSMITTED DISEASE    HPI  Patient reports that she has had x 1 wk thick, white disch with odor and internal vaginal irritation.  States that she was sexually active on 11/15/2018 with a condom.  See flowsheet for further details and programmatic requirements.    The following portions of the patient's history were reviewed and updated as appropriate: allergies, current medications, past medical history, past social history, past surgical history and problem list.  Objective:  There were no vitals filed for this visit.  Physical Exam Constitutional:      Appearance: She is normal weight.  Neck:     Musculoskeletal: Neck supple. No muscular tenderness.  Genitourinary:    General: Normal vulva.     Vagina: Vaginal discharge present.     Comments: Thick, white disch  adhering to vaginal wall, +odorm, ph >4.5 Lymphadenopathy:     Cervical: No cervical adenopathy.  Neurological:     Mental Status: She is alert.    Assessment and Plan:  Heidi Mata is a 33 y.o. female presenting to the [facility] Department for STI screening  1. Screening examination for venereal disease  - WET PREP FOR [place], YEAST, CLUE -  Chlamydia/Gonorrhea [place] [facility]  2. Bacterial vaginitis - clindamycin (CLEOCIN) 2 % vaginal cream 1 Applicatorful  2. History of herpes genitalis - valACYclovir (VALTREX) 1000 MG tablet; Take 1 tablet (1,000 mg total) by mouth 2 (two) times daily.  Dispense: 5 tablet; Refill: 12  3. BV (bacterial vaginosis) -Client states allergic to Metronidazole - clindamycin (CLEOCIN) 2 % vaginal cream 1 Applicatorful x 7 days  4. Anxiety and depression - Ambulatory referral to Behavioral Health     No follow-ups on file.  No future appointments.  Hassell Done, FNP

## 2018-11-22 NOTE — Progress Notes (Signed)
Here for STD screening today. Declines bloodwork. Hal Morales, RN Wet Prep results reviewed. Patient treated for BV per standing orders and allergy to Metronidazole. Hal Morales, RN

## 2019-03-08 ENCOUNTER — Ambulatory Visit: Payer: Self-pay

## 2019-03-08 ENCOUNTER — Other Ambulatory Visit: Payer: Self-pay

## 2019-03-09 ENCOUNTER — Ambulatory Visit: Payer: Self-pay

## 2019-03-13 ENCOUNTER — Ambulatory Visit: Payer: Self-pay | Admitting: Family Medicine

## 2019-03-13 ENCOUNTER — Encounter: Payer: Self-pay | Admitting: Family Medicine

## 2019-03-13 ENCOUNTER — Other Ambulatory Visit: Payer: Self-pay

## 2019-03-13 DIAGNOSIS — B009 Herpesviral infection, unspecified: Secondary | ICD-10-CM

## 2019-03-13 DIAGNOSIS — N760 Acute vaginitis: Secondary | ICD-10-CM

## 2019-03-13 DIAGNOSIS — B9689 Other specified bacterial agents as the cause of diseases classified elsewhere: Secondary | ICD-10-CM

## 2019-03-13 DIAGNOSIS — Z3009 Encounter for other general counseling and advice on contraception: Secondary | ICD-10-CM

## 2019-03-13 DIAGNOSIS — Z113 Encounter for screening for infections with a predominantly sexual mode of transmission: Secondary | ICD-10-CM

## 2019-03-13 LAB — WET PREP FOR TRICH, YEAST, CLUE
Trichomonas Exam: NEGATIVE
Yeast Exam: NEGATIVE

## 2019-03-13 MED ORDER — CLINDAMYCIN PHOSPHATE 2 % VA CREA: 1.0000 | TOPICAL_CREAM | Freq: Every day | VAGINAL | 0 refills | Status: AC

## 2019-03-13 MED ORDER — CLINDAMYCIN PHOSPHATE 2 % VA CREA: 1.0000 | TOPICAL_CREAM | Freq: Every day | VAGINAL | Status: DC

## 2019-03-13 MED ORDER — VALACYCLOVIR HCL 500 MG PO TABS: 500.0000 mg | ORAL_TABLET | Freq: Every day | ORAL | 11 refills | Status: AC

## 2019-03-13 NOTE — Progress Notes (Signed)
STI clinic/screening visit  Subjective:  Heidi Mata is a 33 y.o. female being seen today for an STI screening visit. The patient reports they do have symptoms.  Patient has the following medical conditions:   Patient Active Problem List   Diagnosis Date Noted  . History of herpes genitalis 05/07/2017     Chief Complaint  Patient presents with  . SEXUALLY TRANSMITTED DISEASE    HPI  Patient reports she has been having vaginal irritation, abdominal pain, possibly discharge. Has had BV in the past, feels similar. Would like STI testing today.   HSV: she currently takes valacyclovir episodically with initial symptoms of HSV outbreak. She has about 1 episode per 3 months that can last 1+ week. Would like suppressive therapy.   Uses condoms sometimes, no other contraception. Has a 2 year old daughter, is not ready for another child.   See flowsheet for further details and programmatic requirements.    The following portions of the patient's history were reviewed and updated as appropriate: allergies, current medications, past medical history, past social history, past surgical history and problem list.  Objective:  There were no vitals filed for this visit.  Physical Exam Vitals signs and nursing note reviewed.  Constitutional:      Appearance: Normal appearance.  HENT:     Head: Normocephalic and atraumatic.     Mouth/Throat:     Mouth: Mucous membranes are moist.     Pharynx: Oropharynx is clear. No oropharyngeal exudate or posterior oropharyngeal erythema.  Pulmonary:     Effort: Pulmonary effort is normal.  Abdominal:     General: Abdomen is flat.     Palpations: There is no mass.     Tenderness: There is no abdominal tenderness. There is no rebound.  Genitourinary:    General: Normal vulva.     Exam position: Lithotomy position.     Pubic Area: No rash or pubic lice.      Labia:        Right: No rash or lesion.        Left: No rash or lesion.    Vagina: Vaginal discharge (white, creamy, pH >4.5) present. No erythema, bleeding or lesions.     Cervix: No cervical motion tenderness, discharge, friability, lesion or erythema.     Uterus: Normal.      Adnexa: Right adnexa normal and left adnexa normal.     Rectum: Normal.  Lymphadenopathy:     Head:     Right side of head: No preauricular or posterior auricular adenopathy.     Left side of head: No preauricular or posterior auricular adenopathy.     Cervical: No cervical adenopathy.     Upper Body:     Right upper body: No supraclavicular or axillary adenopathy.     Left upper body: No supraclavicular or axillary adenopathy.     Lower Body: No right inguinal adenopathy. No left inguinal adenopathy.  Skin:    General: Skin is warm and dry.     Findings: No rash.  Neurological:     Mental Status: She is alert and oriented to person, place, and time.       Assessment and Plan:  [name] is a 33 y.o. female presenting to the [facility] Department for STI screening   1. Screen for STD (sexually transmitted disease) -Exam without tenderness. Screenings today as below. Treat wet prep per standing order -Pt declines HIV, Syphilis testing. Patient does meet criteria  for HepB HepC Screening. Declines these screenings. -Discussed that GC/CT NAAT may take >1 week to result. Pt will be called with positive results and encouraged pt to call if haven't heard in 2 weeks. Counseled on warning s/sx and when to seek care. Recommended condom use with all sex. - WET PREP FOR [place], YEAST, [place] [facility]  2. HSV infection Pt would like suppressive therapy. Less than 10 outbreaks/year. Rx as below. - valACYclovir (VALTREX) 500 MG tablet; Take 1 tablet (500 mg total) by mouth daily.  Dispense: 30 tablet; Refill: 11  3. General counseling and advice for contraceptive management She is interested but not ready to decide on [facility] today. Contraceptive  methods discussed and pamphlet given to review at home. Pt to RTC when ready. Condoms provided.  4. BV (bacterial vaginosis) Wet mount with clue cells, amine, ph>4.5. Pt has repeated experiences of nausea/vomiting with metronidazole, we discussed trying again but she declines. Accepts Clindamycin which worked in 12/07/18. - clindamycin (CLEOCIN) 2 % vaginal cream 1 Applicatorful  Return if symptoms worsen or fail to improve.  No future appointments.  Kandee Keen, PA-C

## 2019-03-13 NOTE — Progress Notes (Signed)
Wet mount reviewed and pt received Clindamycin cream to treat BV per provider order. Provider orders completed.Ronny Bacon, RN

## 2019-06-25 ENCOUNTER — Ambulatory Visit: Payer: Self-pay

## 2019-07-05 ENCOUNTER — Emergency Department
Admission: EM | Admit: 2019-07-05 | Discharge: 2019-07-05 | Disposition: A | Discharge status: Home / Self Care | Payer: Self-pay | Attending: Emergency Medicine | Admitting: Emergency Medicine

## 2019-07-05 ENCOUNTER — Other Ambulatory Visit: Payer: Self-pay

## 2019-07-05 DIAGNOSIS — R109 Unspecified abdominal pain: Secondary | ICD-10-CM | POA: Insufficient documentation

## 2019-07-05 DIAGNOSIS — Z5321 Procedure and treatment not carried out due to patient leaving prior to being seen by health care provider: Secondary | ICD-10-CM | POA: Insufficient documentation

## 2019-07-05 LAB — COMPREHENSIVE METABOLIC PANEL
ALT: 13 U/L (ref 0–44)
AST: 25 U/L (ref 15–41)
Albumin: 4.4 g/dL (ref 3.5–5.0)
Alkaline Phosphatase: 61 U/L (ref 38–126)
Anion gap: 8 (ref 5–15)
BUN: 12 mg/dL (ref 6–20)
CO2: 24 mmol/L (ref 22–32)
Calcium: 9.5 mg/dL (ref 8.9–10.3)
Chloride: 104 mmol/L (ref 98–111)
Creatinine, Ser: 1.1 mg/dL — ABNORMAL HIGH (ref 0.44–1.00)
GFR calc Af Amer: >60 mL/min (ref >60)
GFR calc non Af Amer: >60 mL/min (ref >60)
Glucose, Bld: 107 mg/dL — ABNORMAL HIGH (ref 70–99)
Potassium: 3.9 mmol/L (ref 3.5–5.1)
Sodium: 136 mmol/L (ref 135–145)
Total Bilirubin: 0.8 mg/dL (ref 0.3–1.2)
Total Protein: 8 g/dL (ref 6.5–8.1)

## 2019-07-05 LAB — URINALYSIS, COMPLETE (UACMP) WITH MICROSCOPIC
Bacteria, UA: NONE SEEN
Bilirubin Urine: NEGATIVE
Glucose, UA: NEGATIVE mg/dL
Hgb urine dipstick: NEGATIVE
Ketones, ur: NEGATIVE mg/dL
Leukocytes,Ua: NEGATIVE
Nitrite: NEGATIVE
Protein, ur: NEGATIVE mg/dL
Specific Gravity, Urine: 1.015 (ref 1.005–1.030)
pH: 6 (ref 5.0–8.0)

## 2019-07-05 LAB — CBC
HCT: 34 % — ABNORMAL LOW (ref 36.0–46.0)
Hemoglobin: 10.9 g/dL — ABNORMAL LOW (ref 12.0–15.0)
MCH: 26.2 pg (ref 26.0–34.0)
MCHC: 32.1 g/dL (ref 30.0–36.0)
MCV: 81.7 fL (ref 80.0–100.0)
Platelets: 222 10*3/uL (ref 150–400)
RBC: 4.16 MIL/uL (ref 3.87–5.11)
RDW: 13.9 % (ref 11.5–15.5)
WBC: 7.8 10*3/uL (ref 4.0–10.5)
nRBC: 0 % (ref 0.0–0.2)

## 2019-07-05 LAB — LIPASE, BLOOD: Lipase: 35 U/L (ref 11–51)

## 2019-07-05 MED ORDER — SODIUM CHLORIDE 0.9% FLUSH: 3.0000 mL | Freq: Once | INTRAVENOUS | Status: DC

## 2019-07-05 NOTE — ED Triage Notes (Signed)
Pt to the er for lower abd pain and right sided back pain, vaginal d/c and constipation. Pt last BM was yesterday. Pt states she has a hx of BV.

## 2019-07-05 NOTE — ED Triage Notes (Signed)
First RN Note: Pt presents to ED via POV with c/o lower abdominal and back pain. Upon arrival to ED pt alert and oriented at this time, playing on cell phone while speaking with registration, NAD noted, ambulatory without difficulty.

## 2019-07-06 LAB — POCT PREGNANCY, URINE: Preg Test, Ur: NEGATIVE

## 2019-07-09 ENCOUNTER — Telehealth: Payer: Self-pay | Admitting: Emergency Medicine

## 2019-07-09 NOTE — Telephone Encounter (Signed)
Called patient due to lwot to inquire about condition and follow up plans. She says she is not any better.  Says she is having trouble getting around now and caring for her children.  She has appt with health dept Wednesday, but says she knows something is really wrong.  I told her she can always return here.  She says she is going to try to get child care today so she can come back.

## 2019-07-11 ENCOUNTER — Ambulatory Visit: Payer: Self-pay | Admitting: Physician Assistant

## 2019-07-11 ENCOUNTER — Other Ambulatory Visit: Payer: Self-pay

## 2019-07-11 ENCOUNTER — Encounter: Payer: Self-pay | Admitting: Physician Assistant

## 2019-07-11 DIAGNOSIS — Z113 Encounter for screening for infections with a predominantly sexual mode of transmission: Secondary | ICD-10-CM

## 2019-07-11 DIAGNOSIS — B9689 Other specified bacterial agents as the cause of diseases classified elsewhere: Secondary | ICD-10-CM

## 2019-07-11 DIAGNOSIS — N760 Acute vaginitis: Secondary | ICD-10-CM

## 2019-07-11 LAB — WET PREP FOR TRICH, YEAST, CLUE
Trichomonas Exam: NEGATIVE
Yeast Exam: NEGATIVE

## 2019-07-11 MED ORDER — METRONIDAZOLE 0.75 % VA GEL: 1.0000 | Freq: Every day | VAGINAL | 0 refills | Status: AC

## 2019-07-11 NOTE — Progress Notes (Signed)
Patient treated for BV with Metrogel per provider orders.Jenetta Downer, RN

## 2019-07-11 NOTE — Progress Notes (Signed)
[facility] Department STI clinic/screening visit  Subjective:  Heidi Mata is a 34 y.o. female being seen today for an STI screening visit. The patient reports they do have symptoms.  Patient reports that they do not desire a pregnancy in the next year.   They reported they are not interested in discussing contraception today.  No LMP recorded.   Patient has the following medical conditions:   Patient Active Problem List   Diagnosis Date Noted  . History of herpes genitalis 05/07/2017    Chief Complaint  Patient presents with  . SEXUALLY TRANSMITTED DISEASE    STD screening    HPI  Patient reports that she has had yellow discharge that "gushes" for 2-3 weeks.  Also, reports lower abdominal cramping, decreased appetite and dark urine.  States that she is "really stressed" and that may be why she has hardly any appetite but that she does eat some.  Reports that she cannot tolerate po Metronidazole but has used Metro gel without difficulty.  See flowsheet for further details and programmatic requirements.    The following portions of the patient's history were reviewed and updated as appropriate: allergies, current medications, past medical history, past social history, past surgical history and problem list.  Objective:  There were no vitals filed for this visit.  Physical Exam Constitutional:      General: She is not in acute distress.    Appearance: She is normal weight.  HENT:     Head: Normocephalic and atraumatic.     Comments: No nits,lice, or hair loss. No cervical, supraclavicular or axillary adenopathy.    Mouth/Throat:     Mouth: Mucous membranes are moist.     Pharynx: Oropharynx is clear. No oropharyngeal exudate or posterior oropharyngeal erythema.  Eyes:     Conjunctiva/sclera: Conjunctivae normal.  Pulmonary:     Effort: Pulmonary effort is normal.  Abdominal:     Palpations: Abdomen is soft. There is no mass.     Tenderness: There is no  abdominal tenderness. There is no guarding or rebound.  Genitourinary:    General: Normal vulva.     Rectum: Normal.     Comments: External genitalia/pubic area without nits, lice, edema, erythema, lesions and inguinal adenopathy. Vagina with normal mucosa and moderate amount of thin, light yellowish discharge, pH=>4.5. Cervix without visible lesions. Uterus firm, mobile, nt, no masses, no CMT, no adnexal tenderness or fullness. Musculoskeletal:     Cervical back: Neck supple. No tenderness.  Skin:    General: Skin is warm and dry.     Findings: No bruising, erythema, lesion or rash.  Neurological:     Mental Status: She is alert and oriented to person, place, and time.  Psychiatric:        Mood and Affect: Mood normal.        Behavior: Behavior normal.        Thought Content: Thought content normal.        Judgment: Judgment normal.      Assessment and Plan:  Heidi Mata is a 34 y.o. female presenting to the [facility] Department for STI screening  1. Screening for STD (sexually transmitted disease) Patient into clinic with symptoms.  Declines blood work today. Rec condoms with all sex. Await test results.  Counseled that  RN will call if needs to RTC for further treatment once results are back.  Counseled patient to follow up with PCP and PCP list given. Counseled that due anxiety  her appetite could be lower but stressed importance of eating small amounts regularly and drinking plenty of fluids. LCSW card offered but patient declines today.  - WET PREP FOR [place], [place], [place] [facility]  2. BV (bacterial vaginosis) Treat BV with Vandazole 0.75 % gel 1 app qhs for 7 days. No sex for 7 days. Rec to use OTC antifungal cream if has itching after using antibiotic gel. - metroNIDAZOLE (METROGEL VAGINAL) 0.75 % vaginal gel; Place 1 Applicatorful vaginally at bedtime for 7 days.  Dispense: 70 g; Refill: 0     No follow-ups on file.  No  future appointments.  Jerene Dilling, PA

## 2019-09-25 ENCOUNTER — Emergency Department: Payer: Self-pay

## 2019-09-25 ENCOUNTER — Other Ambulatory Visit: Payer: Self-pay

## 2019-09-25 ENCOUNTER — Encounter: Payer: Self-pay | Admitting: Intensive Care

## 2019-09-25 ENCOUNTER — Emergency Department
Admission: EM | Admit: 2019-09-25 | Discharge: 2019-09-25 | Disposition: A | Discharge status: Home / Self Care | Payer: Self-pay | Attending: Emergency Medicine | Admitting: Emergency Medicine

## 2019-09-25 DIAGNOSIS — R0781 Pleurodynia: Secondary | ICD-10-CM | POA: Insufficient documentation

## 2019-09-25 DIAGNOSIS — Y9389 Activity, other specified: Secondary | ICD-10-CM | POA: Insufficient documentation

## 2019-09-25 DIAGNOSIS — R0789 Other chest pain: Secondary | ICD-10-CM | POA: Insufficient documentation

## 2019-09-25 DIAGNOSIS — Z79899 Other long term (current) drug therapy: Secondary | ICD-10-CM | POA: Insufficient documentation

## 2019-09-25 DIAGNOSIS — Y999 Unspecified external cause status: Secondary | ICD-10-CM | POA: Insufficient documentation

## 2019-09-25 DIAGNOSIS — M79622 Pain in left upper arm: Secondary | ICD-10-CM | POA: Insufficient documentation

## 2019-09-25 DIAGNOSIS — X500XXA Overexertion from strenuous movement or load, initial encounter: Secondary | ICD-10-CM | POA: Insufficient documentation

## 2019-09-25 DIAGNOSIS — Y929 Unspecified place or not applicable: Secondary | ICD-10-CM | POA: Insufficient documentation

## 2019-09-25 LAB — CBC
HCT: 34.6 % — ABNORMAL LOW (ref 36.0–46.0)
Hemoglobin: 11.2 g/dL — ABNORMAL LOW (ref 12.0–15.0)
MCH: 25.7 pg — ABNORMAL LOW (ref 26.0–34.0)
MCHC: 32.4 g/dL (ref 30.0–36.0)
MCV: 79.4 fL — ABNORMAL LOW (ref 80.0–100.0)
Platelets: 246 10*3/uL (ref 150–400)
RBC: 4.36 MIL/uL (ref 3.87–5.11)
RDW: 13.7 % (ref 11.5–15.5)
WBC: 7.1 10*3/uL (ref 4.0–10.5)
nRBC: 0 % (ref 0.0–0.2)

## 2019-09-25 LAB — BASIC METABOLIC PANEL
Anion gap: 8 (ref 5–15)
BUN: 12 mg/dL (ref 6–20)
CO2: 22 mmol/L (ref 22–32)
Calcium: 9.4 mg/dL (ref 8.9–10.3)
Chloride: 107 mmol/L (ref 98–111)
Creatinine, Ser: 1.02 mg/dL — ABNORMAL HIGH (ref 0.44–1.00)
GFR calc Af Amer: >60 mL/min (ref >60)
GFR calc non Af Amer: >60 mL/min (ref >60)
Glucose, Bld: 102 mg/dL — ABNORMAL HIGH (ref 70–99)
Potassium: 4 mmol/L (ref 3.5–5.1)
Sodium: 137 mmol/L (ref 135–145)

## 2019-09-25 LAB — TROPONIN I (HIGH SENSITIVITY): Troponin I (High Sensitivity): 2 ng/L (ref <18)

## 2019-09-25 IMAGING — CR DG CHEST 2V
1 series · 2 of 2 positions shown · non-contrast
Comparison: [DATE]

CLINICAL DATA: Left-sided chest pain for 1 day

EXAM:
CHEST - 2 VIEW

[Series 1: dg chest 2 view · 0.14mm/px · 2 of 2 slices shown]
[im 1/2]
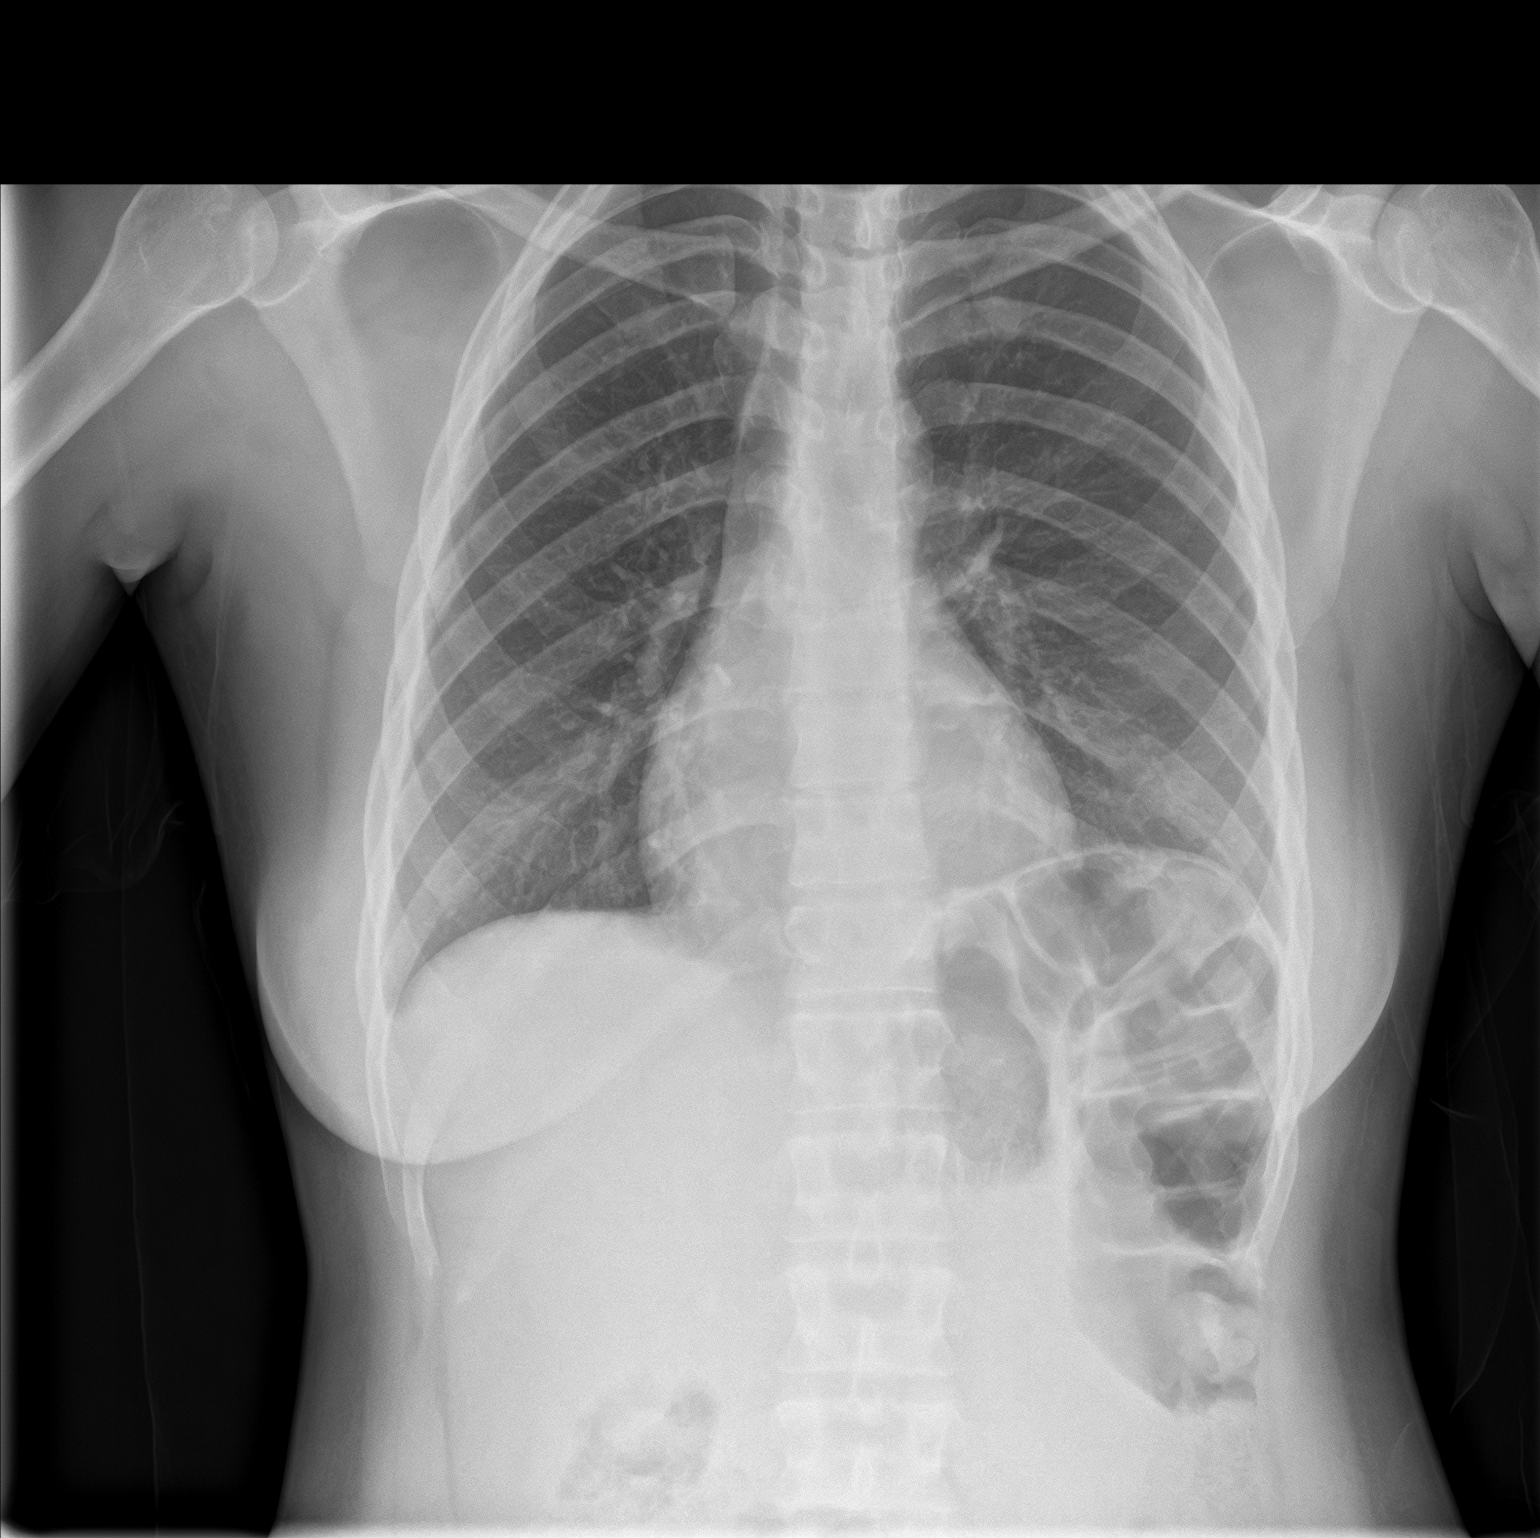
[im 2/2]
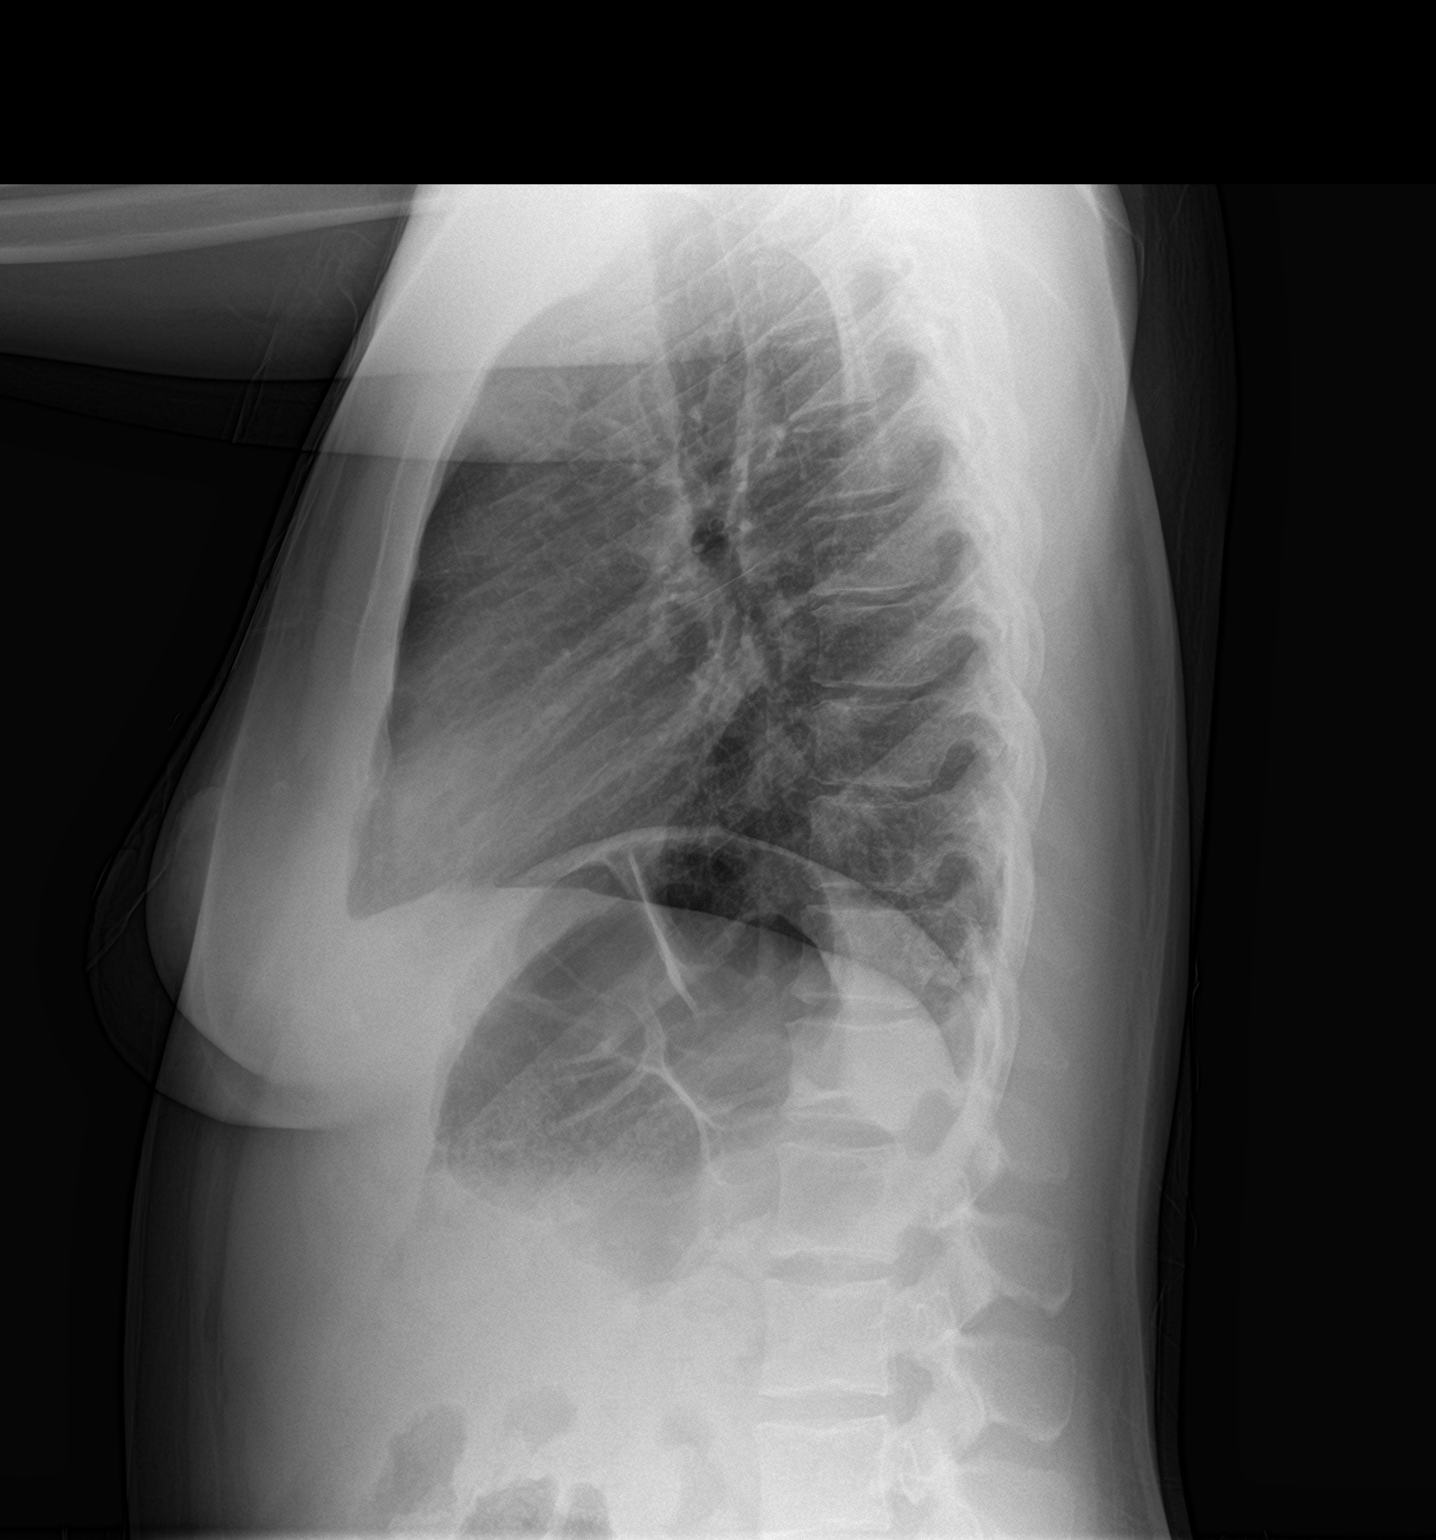

[2 of 2 positions shown; findings below may reference images not displayed]

FINDINGS: Frontal and lateral views of the chest demonstrate an unremarkable
cardiac silhouette. No airspace disease, effusion, or pneumothorax.
No acute bony abnormalities.
IMPRESSION: 1. No acute intrathoracic process.

## 2019-09-25 MED ORDER — MORPHINE SULFATE (PF) 4 MG/ML IV SOLN
4.0000 mg | Freq: Once | INTRAVENOUS | Status: AC
  Administered 2019-09-25: 4 mg via INTRAMUSCULAR
  Filled 2019-09-25: qty 1

## 2019-09-25 MED ORDER — OXYCODONE-ACETAMINOPHEN 5-325 MG PO TABS
1.0000 | ORAL_TABLET | ORAL | Status: DC | PRN
  Administered 2019-09-25: 1 via ORAL
  Filled 2019-09-25: qty 1

## 2019-09-25 MED ORDER — PREDNISONE 50 MG PO TABS: 50.0000 mg | ORAL_TABLET | Freq: Every day | ORAL | 0 refills | Status: DC

## 2019-09-25 MED ORDER — DEXAMETHASONE SODIUM PHOSPHATE 10 MG/ML IJ SOLN
10.0000 mg | Freq: Once | INTRAMUSCULAR | Status: AC
  Administered 2019-09-25: 10 mg via INTRAMUSCULAR
  Filled 2019-09-25: qty 1

## 2019-09-25 MED ORDER — KETOROLAC TROMETHAMINE 30 MG/ML IJ SOLN
30.0000 mg | Freq: Once | INTRAMUSCULAR | Status: AC
  Administered 2019-09-25: 30 mg via INTRAMUSCULAR
  Filled 2019-09-25: qty 1

## 2019-09-25 MED ORDER — HYDROCODONE-ACETAMINOPHEN 5-325 MG PO TABS: 1.0000 | ORAL_TABLET | ORAL | 0 refills | Status: AC | PRN

## 2019-09-25 MED ORDER — ONDANSETRON 8 MG PO TBDP
8.0000 mg | ORAL_TABLET | Freq: Once | ORAL | Status: AC
  Administered 2019-09-25: 8 mg via ORAL

## 2019-09-25 MED ORDER — MELOXICAM 15 MG PO TABS: 15.0000 mg | ORAL_TABLET | Freq: Every day | ORAL | 0 refills | Status: AC

## 2019-09-25 NOTE — ED Notes (Signed)
See triage note  Presents with sudden onset of left sided rib/chest pain    Pain moves into axilla  Increased pain with inspiration

## 2019-09-25 NOTE — ED Provider Notes (Signed)
[facility] Emergency Department Provider Note  ____________________________________________  Time seen: Approximately 4:49 PM  I have reviewed the triage vital signs and the nursing notes.   HISTORY  Chief Complaint Chest Pain    HPI Heidi Mata is a 34 y.o. female who presents the emergency department complaining of sharp pain along the left ribs and lateral chest wall.  Patient states that symptoms began roughly a week ago after she had picked up her child.  She felt a pulling sensation in the side of her chest.  Initially pain was very manageable and patient did not think much of it.  Patient states that the pain has been increasing and over the past several hours it has become unbearable.  Patient has no substernal pain.  She is able to reproduce the pain with movement and palpation of the left side of her chest.  She denies any overlying skin changes.  No direct trauma to the chest wall.  Patient denies any difficulty breathing.  No cardiac history.  Patient denies any recent URI symptoms.  No fevers or chills.  Over-the-counter medications are not alleviating patient's symptoms.         Past Medical History:  Diagnosis Date  . Anemia   . Anemia affecting pregnancy 05/07/2017  . Anxiety   . 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 Fibroid   . Hemosiderosis   . Migraines     Patient Active Problem List   Diagnosis Date Noted  . History of herpes genitalis 05/07/2017    Past Surgical History:  Procedure Laterality Date  . BREAST SURGERY     cyst removal, right  . CYST EXCISION    . ECTOPIC PREGNANCY SURGERY  2009  . LAPAROSCOPY FOR ECTOPIC PREGNANCY      Prior to Admission medications   Medication Sig Start Date End Date Taking? Authorizing Provider  ferrous sulfate 325 (65 FE) MG tablet Take 325 mg by mouth every Wednesday.     [provider]  guaiFENesin-codeine 100-10 MG/5ML syrup Take 5 mLs by mouth every 6 (six) hours as  needed. Patient not taking: Reported on 11/22/2018 04/26/18   Johnn Hai, PA-C  HYDROcodone-acetaminophen (NORCO/VICODIN) 5-325 MG tablet Take 1 tablet by mouth every 4 (four) hours as needed for moderate pain. 09/25/19   Jacqualyn Sedgwick, Charline Bills, PA-C  meloxicam (MOBIC) 15 MG tablet Take 1 tablet (15 mg total) by mouth daily. 09/25/19   Clayburn Weekly, Charline Bills, PA-C  polyethylene glycol (MIRALAX / GLYCOLAX) packet Take 17 g by mouth daily. Patient not taking: Reported on 11/22/2018 10/04/17   Earleen Newport, MD  predniSONE (DELTASONE) 50 MG tablet Take 1 tablet (50 mg total) by mouth daily with breakfast. 09/25/19   Joylene Wescott, Charline Bills, PA-C  valACYclovir (VALTREX) 500 MG tablet Take 1 tablet (500 mg total) by mouth daily. 03/13/19   Staples, Dorinda Hill, PA-C    Allergies Lactose intolerance (gi), Sulfa antibiotics, Zoloft [sertraline hcl], Metronidazole, and Sertraline  Family History  Problem Relation Age of Onset  . Migraines Mother   . Depression Paternal Grandmother     Social History Social History   Tobacco Use  . Smoking status: Never Smoker  . Smokeless tobacco: Never Used  Substance Use Topics  . Alcohol use: Yes    Comment: social  . Drug use: Yes    Frequency: 10.0 times per week    Types: Marijuana     Review of Systems  Constitutional: No fever/chills Eyes: No visual changes.  No discharge ENT: No upper respiratory complaints. Cardiovascular: no substernal chest pain. Respiratory: no cough. No SOB. Gastrointestinal: No abdominal pain.  No nausea, no vomiting.  No diarrhea.  No constipation. Musculoskeletal: Left rib and lateral chest pain. Skin: Negative for rash, abrasions, lacerations, ecchymosis. Neurological: Negative for headaches, focal weakness or numbness. 10-point ROS otherwise negative.  ____________________________________________   PHYSICAL EXAM:  VITAL SIGNS: ED Triage Vitals  Enc Vitals Group     BP 09/25/19 1552 (!) 138/98     Pulse Rate  09/25/19 1552 97     Resp 09/25/19 1552 (!) 22     Temp 09/25/19 1552 99.2 F (37.3 C)     Temp Source 09/25/19 1552 Oral     SpO2 09/25/19 1552 100 %     Weight 09/25/19 1548 140 lb (63.5 kg)     Height 09/25/19 1548 5\' 2"  (1.575 m)     Head Circumference --      Peak Flow --      Pain Score 09/25/19 1548 10     Pain Loc --      Pain Edu? --      Excl. in [place]? --      Constitutional: Alert and oriented. Well appearing and in no acute distress. Eyes: Conjunctivae are normal. PERRL. EOMI. Head: Atraumatic. ENT:      Ears:       Nose: No congestion/rhinnorhea.      Mouth/Throat: Mucous membranes are moist.  Neck: No stridor.  No cervical spine tenderness to palpation.  Cardiovascular: Normal rate, regular rhythm. Normal S1 and S2.  No murmurs, rubs, gallops.  No apical heave.  Good peripheral circulation. Respiratory: Normal respiratory effort without tachypnea or retractions. Lungs CTAB. Good air entry to the bases with no decreased or absent breath sounds. Gastrointestinal: Bowel sounds 4 quadrants. Soft and nontender to palpation. No guarding or rigidity. No palpable masses. No distention. No CVA tenderness. Musculoskeletal: Full range of motion to all extremities. No gross deformities appreciated.  Visualization of the chest wall reveals no overlying skin changes of erythema or edema.  No skin lesions identified.  Patient with no paradoxical chest wall movement.  Equal rise and fall of the chest wall.  Palpation reveals tenderness starting along the inferior left sternal border extending along the ribs and intercostal margins around the ribs to the thoracic spine region.  No palpable abnormality.  No subcutaneous emphysema.  Good underlying breath sounds.  No tenderness to palpation over the thoracic spine itself. Neurologic:  Normal speech and language. No gross focal neurologic deficits are appreciated.  Skin:  Skin is warm, dry and intact. No rash noted. Psychiatric: Mood and  affect are normal. Speech and behavior are normal. Patient exhibits appropriate insight and judgement.   ____________________________________________   LABS (all labs ordered are listed, but only abnormal results are displayed)  Labs Reviewed  BASIC METABOLIC PANEL - Abnormal; Notable for the following components:      Result Value   Glucose, Bld 102 (*)    Creatinine, Ser 1.02 (*)    All other components within normal limits  CBC - Abnormal; Notable for the following components:   Hemoglobin 11.2 (*)    HCT 34.6 (*)    MCV 79.4 (*)    MCH 25.7 (*)    All other components within normal limits  POC URINE PREG, ED  TROPONIN I (HIGH SENSITIVITY)   ____________________________________________  EKG   ____________________________________________  RADIOLOGY I personally viewed and evaluated these images  as part of my medical decision making, as well as reviewing the written report by the radiologist.  DG Chest 2 View  Result Date: 09/25/2019 CLINICAL DATA:  Left-sided chest pain for 1 day EXAM: CHEST - 2 VIEW COMPARISON:  05/23/2007 FINDINGS: Frontal and lateral views of the chest demonstrate an unremarkable cardiac silhouette. No airspace disease, effusion, or pneumothorax. No acute bony abnormalities. IMPRESSION: 1. No acute intrathoracic process. Electronically Signed   By: Randa Ngo M.D.   On: 09/25/2019 16:37    ____________________________________________    PROCEDURES  Procedure(s) performed:    Procedures    Medications  oxyCODONE-acetaminophen (PERCOCET/ROXICET) 5-325 MG per tablet 1 tablet (1 tablet Oral Given 09/25/19 1606)  ketorolac (TORADOL) 30 MG/ML injection 30 mg (has no administration in time range)  dexamethasone (DECADRON) injection 10 mg (has no administration in time range)  morphine 4 MG/ML injection 4 mg (has no administration in time range)  ondansetron (ZOFRAN-ODT) disintegrating tablet 8 mg (has no administration in time range)      ____________________________________________   INITIAL IMPRESSION / ASSESSMENT AND PLAN / ED COURSE  Pertinent labs & imaging results that were available during my care of the patient were reviewed by me and considered in my medical decision making (see chart for details).  Review of the [place] CSRS was performed in accordance of the [facility] prior to dispensing any controlled drugs.  Clinical Course as of Sep 25 1715  Tue Sep 25, 2019  1708 Patient presented to the emergency department complaining of left lateral rib and chest wall pain.  Patient states that symptoms began roughly a week ago after picking up her child.  They have progressed over the week.  Patient states that they are now unbearable and not relieved with over-the-counter medications.  No cardiac history.  No shortness of breath, fevers or chills, URI symptoms.  Patient denies this pain being substernal.  On exam, no evidence of trauma.  Pain is reproducible with palpation along the sternal border and intercostal margins extending from the anterior chest to the posterior chest wall.  No palpable abnormalities.  Labs, imaging is reassuring at this time.  EKG, troponin reassuring.  Chest x-ray reveals no evidence of consolidation, pneumothorax, rib fractures.  Differential included ACS/STEMI, pneumothorax, bronchitis, pneumonia, rib fracture, costochondritis, shingles.   [JC]    Clinical Course User Index [JC] Karra Pink, Charline Bills, PA-C          Patient's diagnosis is consistent with chest wall pain.  Patient presented to the emergency department complaining of left rib/chest wall pain.  No recent trauma.  Patient denies any URI symptoms.  No cardiac history.  Patient was evaluated with labs, imaging.  Results are reassuring at this time.  Patient symptoms are reproducible with palpation.  I feel this is likely costochondral pain.  Patient will be placed on anti-inflammatory, steroid, limited pain medication for same.  Return  precautions are discussed with the patient..  Patient is given ED precautions to return to the ED for any worsening or new symptoms.     ____________________________________________  FINAL CLINICAL IMPRESSION(S) / ED DIAGNOSES  Final diagnoses:  Chest wall pain      NEW MEDICATIONS STARTED DURING THIS VISIT:  ED Discharge Orders         Ordered    meloxicam (MOBIC) 15 MG tablet  Daily     09/25/19 1717    predniSONE (DELTASONE) 50 MG tablet  Daily with breakfast     09/25/19 1717  HYDROcodone-acetaminophen (NORCO/VICODIN) 5-325 MG tablet  Every 4 hours PRN     09/25/19 1717              This chart was dictated using voice recognition software/Dragon. Despite best efforts to proofread, errors can occur which can change the meaning. Any change was purely unintentional.    Darletta Moll, PA-C 09/25/19 1717    Harvest Dark, MD 09/25/19 2256

## 2019-09-25 NOTE — ED Triage Notes (Signed)
Acute onset left sided/left rib/mid axillary sharp chest pain. SOB noted. PAtient crying in triage

## 2019-11-30 ENCOUNTER — Emergency Department: Payer: No Typology Code available for payment source

## 2019-11-30 ENCOUNTER — Emergency Department
Admission: EM | Admit: 2019-11-30 | Discharge: 2019-11-30 | Disposition: A | Discharge status: Home / Self Care | Payer: No Typology Code available for payment source | Attending: Student in an Organized Health Care Education/Training Program | Admitting: Student in an Organized Health Care Education/Training Program

## 2019-11-30 ENCOUNTER — Encounter: Payer: Self-pay | Admitting: Emergency Medicine

## 2019-11-30 ENCOUNTER — Other Ambulatory Visit: Payer: Self-pay

## 2019-11-30 DIAGNOSIS — T1490XA Injury, unspecified, initial encounter: Secondary | ICD-10-CM

## 2019-11-30 DIAGNOSIS — Z043 Encounter for examination and observation following other accident: Secondary | ICD-10-CM | POA: Insufficient documentation

## 2019-11-30 DIAGNOSIS — M542 Cervicalgia: Secondary | ICD-10-CM | POA: Diagnosis not present

## 2019-11-30 DIAGNOSIS — R112 Nausea with vomiting, unspecified: Secondary | ICD-10-CM | POA: Diagnosis not present

## 2019-11-30 DIAGNOSIS — R519 Headache, unspecified: Secondary | ICD-10-CM | POA: Diagnosis present

## 2019-11-30 LAB — URINALYSIS, COMPLETE (UACMP) WITH MICROSCOPIC
Bacteria, UA: NONE SEEN
Bilirubin Urine: NEGATIVE
Glucose, UA: NEGATIVE mg/dL
Hgb urine dipstick: NEGATIVE
Ketones, ur: NEGATIVE mg/dL
Leukocytes,Ua: NEGATIVE
Nitrite: NEGATIVE
Protein, ur: NEGATIVE mg/dL
Specific Gravity, Urine: 1.021 (ref 1.005–1.030)
pH: 7 (ref 5.0–8.0)

## 2019-11-30 LAB — POCT PREGNANCY, URINE: Preg Test, Ur: NEGATIVE

## 2019-11-30 LAB — BASIC METABOLIC PANEL
Anion gap: 8 (ref 5–15)
BUN: 14 mg/dL (ref 6–20)
CO2: 25 mmol/L (ref 22–32)
Calcium: 9.3 mg/dL (ref 8.9–10.3)
Chloride: 105 mmol/L (ref 98–111)
Creatinine, Ser: 1.05 mg/dL — ABNORMAL HIGH (ref 0.44–1.00)
GFR calc Af Amer: >60 mL/min (ref >60)
GFR calc non Af Amer: >60 mL/min (ref >60)
Glucose, Bld: 100 mg/dL — ABNORMAL HIGH (ref 70–99)
Potassium: 4 mmol/L (ref 3.5–5.1)
Sodium: 138 mmol/L (ref 135–145)

## 2019-11-30 LAB — CBC
HCT: 34.1 % — ABNORMAL LOW (ref 36.0–46.0)
Hemoglobin: 10.7 g/dL — ABNORMAL LOW (ref 12.0–15.0)
MCH: 25.8 pg — ABNORMAL LOW (ref 26.0–34.0)
MCHC: 31.4 g/dL (ref 30.0–36.0)
MCV: 82.2 fL (ref 80.0–100.0)
Platelets: 250 10*3/uL (ref 150–400)
RBC: 4.15 MIL/uL (ref 3.87–5.11)
RDW: 14.2 % (ref 11.5–15.5)
WBC: 9.5 10*3/uL (ref 4.0–10.5)
nRBC: 0 % (ref 0.0–0.2)
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
## 2019-11-30 MED ORDER — IOHEXOL 300 MG/ML  SOLN
100.0000 mL | Freq: Once | INTRAMUSCULAR | Status: AC | PRN
  Administered 2019-11-30: 100 mL via INTRAVENOUS
  Filled 2019-11-30: qty 100

## 2019-11-30 MED ORDER — ONDANSETRON HCL 4 MG/2ML IJ SOLN
4.0000 mg | Freq: Once | INTRAMUSCULAR | Status: AC
  Administered 2019-11-30: 4 mg via INTRAVENOUS
  Filled 2019-11-30: qty 2

## 2019-11-30 MED ORDER — LIDOCAINE 5 % EX PTCH
1.0000 | MEDICATED_PATCH | CUTANEOUS | Status: DC
  Administered 2019-11-30: 1 via TRANSDERMAL
  Filled 2019-11-30: qty 1

## 2019-11-30 MED ORDER — IBUPROFEN 600 MG PO TABS: 600.0000 mg | ORAL_TABLET | Freq: Four times a day (QID) | ORAL | 0 refills | Status: AC | PRN

## 2019-11-30 MED ORDER — METHOCARBAMOL 500 MG PO TABS
500.0000 mg | ORAL_TABLET | Freq: Once | ORAL | Status: AC
  Administered 2019-11-30: 500 mg via ORAL
  Filled 2019-11-30: qty 1

## 2019-11-30 MED ORDER — OXYCODONE-ACETAMINOPHEN 5-325 MG PO TABS
1.0000 | ORAL_TABLET | Freq: Once | ORAL | Status: AC
  Administered 2019-11-30: 1 via ORAL
  Filled 2019-11-30: qty 1

## 2019-11-30 MED ORDER — CYCLOBENZAPRINE HCL 5 MG PO TABS: ORAL_TABLET | ORAL | 0 refills | Status: AC

## 2019-11-30 MED ORDER — KETOROLAC TROMETHAMINE 30 MG/ML IJ SOLN
30.0000 mg | Freq: Once | INTRAMUSCULAR | Status: AC
  Administered 2019-11-30: 30 mg via INTRAVENOUS
  Filled 2019-11-30: qty 1

## 2019-11-30 NOTE — ED Notes (Signed)
Pt c.o back pain, Caryl Pina PA made aware
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
## 2019-11-30 NOTE — ED Notes (Signed)
Pt to CT
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
## 2019-11-30 NOTE — ED Notes (Signed)
Pt provided urine cup to attempt urine sample when able

## 2019-11-30 NOTE — ED Notes (Signed)
Pt notified that mother called for updated, states she will call and update her

## 2020-02-27 ENCOUNTER — Ambulatory Visit (LOCAL_COMMUNITY_HEALTH_CENTER): Payer: Medicaid Other | Admitting: Family Medicine

## 2020-02-27 ENCOUNTER — Encounter: Payer: Self-pay | Admitting: Family Medicine

## 2020-02-27 ENCOUNTER — Other Ambulatory Visit: Payer: Self-pay

## 2020-02-27 VITALS — BP 110/65 | Ht 63.0 in | Wt 145.0 lb

## 2020-02-27 DIAGNOSIS — Z3200 Encounter for pregnancy test, result unknown: Secondary | ICD-10-CM

## 2020-02-27 DIAGNOSIS — Z8619 Personal history of other infectious and parasitic diseases: Secondary | ICD-10-CM | POA: Diagnosis not present

## 2020-02-27 DIAGNOSIS — Z30011 Encounter for initial prescription of contraceptive pills: Secondary | ICD-10-CM | POA: Diagnosis not present

## 2020-02-27 DIAGNOSIS — Z113 Encounter for screening for infections with a predominantly sexual mode of transmission: Secondary | ICD-10-CM

## 2020-02-27 DIAGNOSIS — Z3009 Encounter for other general counseling and advice on contraception: Secondary | ICD-10-CM

## 2020-02-27 LAB — WET PREP FOR TRICH, YEAST, CLUE
Trichomonas Exam: POSITIVE — AB
Yeast Exam: NEGATIVE

## 2020-02-27 LAB — PREGNANCY, URINE: Preg Test, Ur: NEGATIVE

## 2020-02-27 MED ORDER — NORGESTREL-ETHINYL ESTRADIOL 0.3-30 MG-MCG PO TABS: 1.0000 | ORAL_TABLET | Freq: Every day | ORAL | 9 refills | Status: AC

## 2020-02-27 MED ORDER — VALACYCLOVIR HCL 1 G PO TABS: 1000.0000 mg | ORAL_TABLET | Freq: Every day | ORAL | 11 refills | Status: AC

## 2020-02-27 MED ORDER — METRONIDAZOLE 0.75 % VA GEL: 1.0000 | Freq: Every day | VAGINAL | Status: AC

## 2020-02-27 NOTE — Progress Notes (Signed)
Family Planning Visit- Repeat Yearly Visit  Subjective:  URI Heidi Mata is a 34 y.o. [phone]  being seen today for an well woman visit and to discuss family planning options.    She is currently using Condoms-sometimes for pregnancy prevention. Patient reports she does not if she or her partner wants a pregnancy in the next year. Patient  has History of herpes genitalis on their problem list.  Chief Complaint  Patient presents with  . Contraception    physical, PT, OCP    Patient reports she is here for her annual exam and birth control  Client is interested OCPs.  She would like to be checked for STDs today also.  Client states her last period was around  10/9th-normal menses reported.  States that she has 2 partners and uses condoms sometimes.  She states that last unprotected sex was 2 days ago but has has unprotected 2-3 times prior to that.  She is requesting a pregnancy test today.  Patient denies othet concerns  See flowsheet for other program required questions.   Body mass index is 25.69 kg/m. - Patient is eligible for diabetes screening based on BMI and age >79?  not applicable GX2J ordered? not applicable  Patient reports 2 of partners in last year. Desires STI screening?  Yes   Has patient been screened once for HCV in the past?  No  No results found for: HCVAB  Does the patient have current of drug use, have a partner with drug use, and/or has been incarcerated since last result? No  If yes-- Screen for HCV through [facility]   Does the patient meet criteria for HBV testing? No  Criteria:  -Household, sexual or needle sharing contact with HBV -History of drug use -HIV positive -Those with known Hep C   Health Maintenance Due  Topic Date Due  . Hepatitis C Screening  Never done  . PAP SMEAR-Modifier  11/25/2018  . INFLUENZA VACCINE  11/18/2019    Review of Systems  Constitutional: Positive for weight loss (not planned- client okay with loss).   Gastrointestinal: Positive for nausea.       Nausea- had with last period last month, 1 time  Genitourinary: Positive for frequency.       Vaginal discharge- increase in amount of discharge, without odor-client has a h/o BV Urinary frequency- no burning, hematuria, drink a lot fluids. Unexplained bleeding- x 1 after sex last month  All other systems reviewed and are negative.   The following portions of the patient's history were reviewed and updated as appropriate: allergies, current medications, past family history, past medical history, past social history, past surgical history and problem list. Problem list updated.  Objective:   Vitals:   02/27/20 1501  BP: 110/65  Weight: 145 lb (65.8 kg)  Height: 5\' 3"  (1.6 m)    Physical Exam Constitutional:      Appearance: She is normal weight.  HENT:     Mouth/Throat:     Mouth: Mucous membranes are moist.     Pharynx: Oropharynx is clear.  Cardiovascular:     Rate and Rhythm: Normal rate.  Pulmonary:     Effort: Pulmonary effort is normal.  Abdominal:     General: Abdomen is flat.     Palpations: Abdomen is soft.  Genitourinary:    General: Normal vulva.     Vagina: Vaginal discharge present.     Comments: Vaginal discharge- thick yellow discharge, + odor noted, ph>4.5 Musculoskeletal:  Cervical back: Neck supple.  Lymphadenopathy:     Cervical: No cervical adenopathy.  Skin:    General: Skin is warm and dry.  Neurological:     Mental Status: She is alert and oriented to person, place, and time.       Assessment and Plan:  Heidi Mata is a 34 y.o. female [phone] presenting to the [facility] Department for an yearly well woman exam/family planning visit  Contraception counseling: Reviewed all forms of birth control options in the tiered based approach. available including abstinence; over the counter/barrier methods; hormonal contraceptive medication including pill, patch, ring,  injection,contraceptive implant, ECP; hormonal and nonhormonal IUDs; permanent sterilization options including vasectomy and the various tubal sterilization modalities. Risks, benefits, and typical effectiveness rates were reviewed.  Questions were answered.  Written information was also given to the patient to review.  Patient desires OCPs, this was prescribed for patient. She will follow up in  2-3 months or PRN  for surveillance.  She was told to call with any further questions, or with any concerns about this method of contraception.  Emphasized use of condoms 100% of the time for STI prevention.  Patient was not an ECP candidate.   1. Possible pregnancy, not confirmed  - Pregnancy, urine- negative.  Co client that this test would not assess pregnancy within the past 10-14 days,    2. Family planning  - WET PREP FOR [place], YEAST, CLUE - Gonococcus culture - IGP, Aptima HPV - Chlamydia/Gonorrhea [facility]  3. OCP (oral contraceptive pills) initiation  - norgestrel-ethinyl estradiol (LO/OVRAL) 0.3-30 MG-MCG tablet; Take 1 tablet by mouth daily.  Dispense: 81 tablet; Refill: 9 Co to use condoms x 1 week after initiation of pills.  Start today,  Co to do a PT in 2 weeks .  If test negative continue with pills and if positive d/c pills and contact clinic.  4. 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 - valACYclovir (VALTREX) 1000 MG tablet; Take 1 tablet (1,000 mg total) by mouth daily for 5 days.  Dispense: 5 tablet; Refill: 11  5. Screening examination for venereal disease- + BV and trichomoniasis on wet prep Client's allergy to oral metronidazole is N & V.  Co client that this is a SE of the medication.  Offered use of the gel, may not be as effective for trich. Client agrees to the Metro gel. - metroNIDAZOLE (METROGEL) 0.75 % vaginal gel 1 Applicatorful Co to notify partners to be treated for trich and not to be sexually active during her  treatment and during partners' treatment.   Encouraged to use condoms for all sexual activity.     No follow-ups on file.  No future appointments.  Hassell Done, FNP

## 2020-02-27 NOTE — Progress Notes (Signed)
Patient here for OCP and physical. Last pap 2015. Desires PT.   Deri Fuelling, RN   Post:  RN reviewed wet mount with patient. RN tx patient for BV and Trich with metro gel per C. Marzetta Board NP orders. Patient given contact card and trich brochure. Provider orders complete.   Deri Fuelling, RN

## 2020-02-29 ENCOUNTER — Telehealth: Payer: Self-pay | Admitting: Family Medicine

## 2020-02-29 NOTE — Telephone Encounter (Signed)
Patient called in to agency and declined to leave a message.  Told clerical that she needed to speak with someone immediately and was transferred directly to clinic phone.  Patient reports that since about 2-3 hrs after her visit on 02/27/2020, she has been in "bad" pain and having heavy bleeding.  States that she has never had bleeding like this after a pelvic exam and that the exam she had was painful.  Patient states that she has not taken any OTC analgesics for pain and has not gotten out of bed since she got home after being in clinic.  Patient sounds upset and tearful on the phone.  Counseled patient that Trichomonas infection can sometimes cause heavy/irregular bleeding and she could also have a fibroid or polyp that can cause irregular bleeding.  Recommended that patient go to the ER for evaluation since she has had heavy bleeding for 2 days and abdominal pain.  Patient states that she does not feel comfortable going by herself and states that she will try to find a friend or family member to take her to the ER.  Counseled patient that she can try taking OTC analgesic in interim to try to decrease her pain.

## 2020-03-02 LAB — IGP, APTIMA HPV
HPV Aptima: NEGATIVE
PAP Smear Comment: 0

## 2020-03-03 LAB — GONOCOCCUS CULTURE

## 2020-05-02 ENCOUNTER — Encounter: Payer: Self-pay | Admitting: Emergency Medicine

## 2020-05-02 ENCOUNTER — Other Ambulatory Visit: Payer: Self-pay

## 2020-05-02 ENCOUNTER — Emergency Department
Admission: EM | Admit: 2020-05-02 | Discharge: 2020-05-02 | Disposition: A | Discharge status: Home / Self Care | Payer: Medicaid Other | Attending: Emergency Medicine | Admitting: Emergency Medicine

## 2020-05-02 DIAGNOSIS — N764 Abscess of vulva: Secondary | ICD-10-CM | POA: Insufficient documentation

## 2020-05-02 DIAGNOSIS — L0291 Cutaneous abscess, unspecified: Secondary | ICD-10-CM

## 2020-05-02 MED ORDER — CEPHALEXIN 500 MG PO CAPS: 500.0000 mg | ORAL_CAPSULE | Freq: Four times a day (QID) | ORAL | 0 refills | Status: AC

## 2020-05-02 MED ORDER — FENTANYL CITRATE (PF) 100 MCG/2ML IJ SOLN
50.0000 ug | Freq: Once | INTRAMUSCULAR | Status: AC
  Administered 2020-05-02: 50 ug via INTRAMUSCULAR
  Filled 2020-05-02: qty 2

## 2020-05-02 MED ORDER — LIDOCAINE HCL 1 % IJ SOLN
5.0000 mL | Freq: Once | INTRAMUSCULAR | Status: DC
  Filled 2020-05-02: qty 10

## 2020-05-02 MED ORDER — DOXYCYCLINE MONOHYDRATE 100 MG PO CAPS: 100.0000 mg | ORAL_CAPSULE | Freq: Two times a day (BID) | ORAL | 0 refills | Status: AC

## 2020-05-02 MED ORDER — IBUPROFEN 400 MG PO TABS
400.0000 mg | ORAL_TABLET | Freq: Once | ORAL | Status: AC | PRN
  Administered 2020-05-02: 400 mg via ORAL
  Filled 2020-05-02: qty 1

## 2020-05-02 NOTE — ED Triage Notes (Signed)
Pt reports abscess to the right side of her vagina for the last 3 days and it is painful. Pt reports it is not in her vagina but on the side

## 2020-05-02 NOTE — ED Provider Notes (Signed)
[facility]-EMERGENCY DEPARTMENT  ____________________________________________  Time seen: Approximately 7:40 PM  I have reviewed the triage vital signs and the nursing notes.   HISTORY  Chief Complaint Abscess   Historian Patient     HPI Shauniece Kwan Mata is a 35 y.o. female presents to the emergency department with a right-sided vaginal abscess.  Abscess is localized to the labia majora but not the Bartholin gland.  Patient states that she has had an abscess in the affected area before and underwent incision and drainage.  She has been symptomatic for the past 3 days without fever and chills.  She has identified a scant amount of spontaneous drainage and has been taking Tylenol and ibuprofen alternating for the pain.   Past Medical History:  Diagnosis Date  . Anemia   . Anemia affecting pregnancy 05/07/2017  . Anxiety   . 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 Fibroid   . Hemosiderosis   . Lung disease   . Mental disorder   . Migraine headache   . Migraines      Immunizations up to date:  Yes.     Past Medical History:  Diagnosis Date  . Anemia   . Anemia affecting pregnancy 05/07/2017  . Anxiety   . 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 Fibroid   . Hemosiderosis   . Lung disease   . Mental disorder   . Migraine headache   . Migraines     Patient Active Problem List   Diagnosis Date Noted  . History of herpes genitalis 05/07/2017    Past Surgical History:  Procedure Laterality Date  . BREAST SURGERY     cyst removal, right  . CYST EXCISION    . ECTOPIC PREGNANCY SURGERY  2009  . LAPAROSCOPY FOR ECTOPIC PREGNANCY      Prior to Admission medications   Medication Sig Start Date End Date Taking? Authorizing Provider  cephALEXin (KEFLEX) 500 MG capsule Take 1 capsule (500 mg total) by mouth 4 (four) times daily for 10 days. 05/02/20 05/12/20 Yes Vallarie Mare M, PA-C  doxycycline (MONODOX) 100 MG capsule Take 1 capsule (100 mg total) by mouth 2 (two) times daily for 7  days. 05/02/20 05/09/20 Yes Vallarie Mare M, PA-C  cyclobenzaprine (FLEXERIL) 5 MG tablet Take 1-2 tablets 3 times daily as needed Patient not taking: Reported on 02/27/2020 11/30/19   Laban Emperor, PA-C  ferrous sulfate 325 (65 FE) MG tablet Take 325 mg by mouth every Wednesday.  Patient not taking: Reported on 02/27/2020    [provider]  guaiFENesin-codeine 100-10 MG/5ML syrup Take 5 mLs by mouth every 6 (six) hours as needed. Patient not taking: Reported on 11/22/2018 04/26/18   Johnn Hai, PA-C  HYDROcodone-acetaminophen (NORCO/VICODIN) 5-325 MG tablet Take 1 tablet by mouth every 4 (four) hours as needed for moderate pain. Patient not taking: Reported on 02/27/2020 09/25/19   Cuthriell, Charline Bills, PA-C  ibuprofen (ADVIL) 600 MG tablet Take 1 tablet (600 mg total) by mouth every 6 (six) hours as needed. Patient not taking: Reported on 02/27/2020 11/30/19   Laban Emperor, PA-C  meloxicam (MOBIC) 15 MG tablet Take 1 tablet (15 mg total) by mouth daily. Patient not taking: Reported on 02/27/2020 09/25/19   Cuthriell, Charline Bills, PA-C  norgestrel-ethinyl estradiol (LO/OVRAL) 0.3-30 MG-MCG tablet Take 1 tablet by mouth daily. 02/27/20   Hassell Done, FNP  polyethylene glycol (MIRALAX / GLYCOLAX) packet Take 17 g by mouth daily. Patient not taking: Reported on 11/22/2018 10/04/17   Lenise Arena  E, MD  predniSONE (DELTASONE) 50 MG tablet Take 1 tablet (50 mg total) by mouth daily with breakfast. Patient not taking: Reported on 02/27/2020 09/25/19   Cuthriell, Charline Bills, PA-C  valACYclovir (VALTREX) 500 MG tablet Take 1 tablet (500 mg total) by mouth daily. 03/13/19   Staples, Dorinda Hill, PA-C    Allergies Lactose intolerance (gi), Sulfa antibiotics, Zoloft [sertraline hcl], Metronidazole, and Sertraline  Family History  Problem Relation Age of Onset  . Migraines Mother   . Diabetes Mother   . Hypertension Mother   . Depression Paternal Grandmother   . Cancer Maternal Uncle      Social History Social History   Tobacco Use  . Smoking status: Never Smoker  . Smokeless tobacco: Never Used  Vaping Use  . Vaping Use: Never used  Substance Use Topics  . Alcohol use: Yes    Comment: social  . Drug use: Yes    Frequency: 10.0 times per week    Types: Marijuana     Review of Systems  Constitutional: No fever/chills Eyes:  No discharge ENT: No upper respiratory complaints. Respiratory: no cough. No SOB/ use of accessory muscles to breath Gastrointestinal:   No nausea, no vomiting.  No diarrhea.  No constipation. Genitourinary: Patient has vaginal abscess.  Musculoskeletal: Negative for musculoskeletal pain. Skin: Negative for rash, abrasions, lacerations, ecchymosis.    ____________________________________________   PHYSICAL EXAM:  VITAL SIGNS: ED Triage Vitals  Enc Vitals Group     BP 05/02/20 1439 121/85     Pulse Rate 05/02/20 1439 93     Resp 05/02/20 1439 18     Temp 05/02/20 1439 99.3 F (37.4 C)     Temp Source 05/02/20 1439 Oral     SpO2 05/02/20 1439 100 %     Weight 05/02/20 1438 140 lb (63.5 kg)     Height 05/02/20 1438 5\' 2"  (1.575 m)     Head Circumference --      Peak Flow --      Pain Score 05/02/20 1437 10     Pain Loc --      Pain Edu? --      Excl. in [place]? --      Constitutional: Alert and oriented. Well appearing and in no acute distress. Eyes: Conjunctivae are normal. PERRL. EOMI. Head: Atraumatic. Cardiovascular: Normal rate, regular rhythm. Normal S1 and S2.  Good peripheral circulation. Respiratory: Normal respiratory effort without tachypnea or retractions. Lungs CTAB. Good air entry to the bases with no decreased or absent breath sounds Gastrointestinal: Bowel sounds x 4 quadrants. Soft and nontender to palpation. No guarding or rigidity. No distention. Genitourinary: Patient has right-sided labial abscess.  There is edema of the right labia with surrounding erythema.  No tenderness to palpation over the right  Bartholin cyst. Musculoskeletal: Full range of motion to all extremities. No obvious deformities noted Neurologic:  Normal for age. No gross focal neurologic deficits are appreciated.  Skin:  Skin is warm, dry and intact. No rash noted. Psychiatric: Mood and affect are normal for age. Speech and behavior are normal.   ____________________________________________   LABS (all labs ordered are listed, but only abnormal results are displayed)  Labs Reviewed - No data to display ____________________________________________  EKG   ____________________________________________  RADIOLOGY   No results found.  ____________________________________________    PROCEDURES  Procedure(s) performed:     Marland KitchenMarland KitchenIncision and Drainage  Date/Time: 05/02/2020 8:18 PM Performed by: Lannie Fields, PA-C Authorized by: Lannie Fields, PA-C  Consent:    Consent obtained:  Verbal   Consent given by:  Patient   Risks discussed:  Bleeding, incomplete drainage, pain and damage to other organs   Alternatives discussed:  No treatment Universal protocol:    Procedure explained and questions answered to patient or proxy's satisfaction: yes     Relevant documents present and verified: yes     Test results available : yes     Imaging studies available: yes     Required blood products, implants, devices, and special equipment available: yes     Site/side marked: yes     Immediately prior to procedure, a time out was called: yes     Patient identity confirmed:  Verbally with patient Location:    Type:  Abscess Pre-procedure details:    Skin preparation:  Betadine Anesthesia:    Anesthesia method:  Local infiltration   Local anesthetic:  Lidocaine 1% WITH epi Procedure type:    Complexity:  Complex Procedure details:    Incision types:  Single straight   Incision depth:  Subcutaneous   Scalpel blade:  11   Wound management:  Probed and deloculated, irrigated with saline and extensive  cleaning   Drainage:  Purulent   Drainage amount:  Moderate Post-procedure details:    Procedure completion:  Tolerated well, no immediate complications       Medications  lidocaine (XYLOCAINE) 1 % (with pres) injection 5 mL (has no administration in time range)  ibuprofen (ADVIL) tablet 400 mg (400 mg Oral Given 05/02/20 1735)  fentaNYL (SUBLIMAZE) injection 50 mcg (50 mcg Intramuscular Given 05/02/20 1941)     ____________________________________________   INITIAL IMPRESSION / ASSESSMENT AND PLAN / ED COURSE  Pertinent labs & imaging results that were available during my care of the patient were reviewed by me and considered in my medical decision making (see chart for details).      Assessment and plan Labial abscess  35 year old female presents to the emergency department with a right-sided labial abscess that has been present for the past 3 days.  Vital signs were reassuring at triage.  On physical exam, patient was alert, active and nontoxic-appearing.  She underwent incision and drainage without complication  Patient was discharged with Keflex and Doxy.  Return precautions were given to return with new or worsening symptoms.  Patient education regarding wound care was given.     ____________________________________________  FINAL CLINICAL IMPRESSION(S) / ED DIAGNOSES  Final diagnoses:  Abscess      NEW MEDICATIONS STARTED DURING THIS VISIT:  ED Discharge Orders         Ordered    doxycycline (MONODOX) 100 MG capsule  2 times daily        05/02/20 2012    cephALEXin (KEFLEX) 500 MG capsule  4 times daily        05/02/20 2012              This chart was dictated using voice recognition software/Dragon. Despite best efforts to proofread, errors can occur which can change the meaning. Any change was purely unintentional.     Karren Cobble 05/02/20 2020    Carrie Mew, MD 05/02/20 2315

## 2020-05-02 NOTE — Discharge Instructions (Signed)
Take doxycycline twice daily for the next 7 days. Take Keflex 4 times daily for the next 7 days. Gently exfoliate area with warm soapy water and encourage expression at home. You can use a product called Tend skin daily after skin is completely dry to help avoid folliculitis in the future. When shaving, please only shave in the direction the hair grows. Please try to exfoliate every other day using a washcloth.  Please bleach exfoliating washcloth once weekly.

## 2020-05-02 NOTE — ED Notes (Signed)
Lidocaine to provider.

## 2020-08-18 ENCOUNTER — Encounter: Payer: Self-pay | Admitting: Nurse Practitioner

## 2020-08-18 NOTE — Progress Notes (Signed)
PAP normal, HPV negative.  Repeat PAP in 3 years (02/2023) per Lauretta Chester, MD. Routed 08/14/2020.  PAP card mailed today.  MyCHART account not active. Gregary Cromer, RN

## 2020-10-21 NOTE — Progress Notes (Signed)
Pap was never triaged in 2021  NIL with negative HPV. Next pap in 5 years, updated HCM.  Routed to Pap RN

## 2020-12-16 ENCOUNTER — Other Ambulatory Visit: Payer: Self-pay | Admitting: Orthopedic Surgery

## 2020-12-16 DIAGNOSIS — S161XXD Strain of muscle, fascia and tendon at neck level, subsequent encounter: Secondary | ICD-10-CM

## 2020-12-16 DIAGNOSIS — M50123 Cervical disc disorder at C6-C7 level with radiculopathy: Secondary | ICD-10-CM

## 2020-12-16 DIAGNOSIS — M542 Cervicalgia: Secondary | ICD-10-CM

## 2020-12-18 ENCOUNTER — Ambulatory Visit
Admission: RE | Admit: 2020-12-18 | Discharge: 2020-12-18 | Disposition: A | Discharge status: Home / Self Care | Payer: Medicaid Other | Source: Ambulatory Visit | Attending: Orthopedic Surgery | Admitting: Orthopedic Surgery

## 2020-12-18 DIAGNOSIS — M542 Cervicalgia: Secondary | ICD-10-CM | POA: Insufficient documentation

## 2020-12-18 DIAGNOSIS — M50123 Cervical disc disorder at C6-C7 level with radiculopathy: Secondary | ICD-10-CM | POA: Insufficient documentation

## 2020-12-18 DIAGNOSIS — S161XXD Strain of muscle, fascia and tendon at neck level, subsequent encounter: Secondary | ICD-10-CM | POA: Insufficient documentation

## 2020-12-18 IMAGING — MR MR CERVICAL SPINE W/O CM
5 series · 36 of 48 positions shown · non-contrast
Comparison: Prior CT from [DATE].

CLINICAL DATA: Initial evaluation for neck pain with extension into
the right shoulder for 1 year.

EXAM:
MRI CERVICAL SPINE WITHOUT CONTRAST
TECHNIQUE: Multiplanar, multisequence MR imaging of the cervical spine was
performed. No intravenous contrast was administered.

[Series 5: T2 · sagittal · 3.0mm · 0.62mm/px · 6 of 15 slices shown (1 of 2)]
[im 1/15]
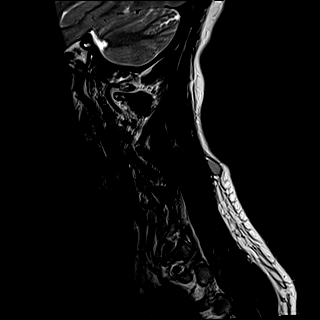
[im 3/15]
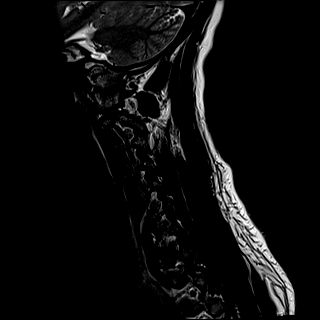
[im 6/15]
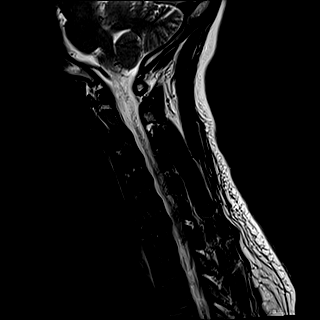
[im 9/15]
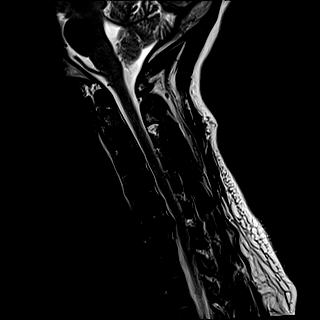
[im 12/15]
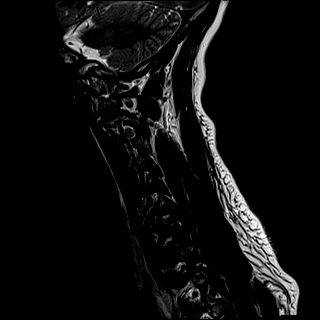
[im 15/15]
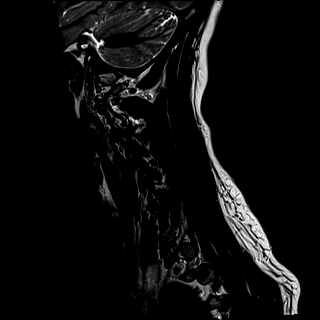

[Series 6: FLAIR · sagittal · 3.0mm · 0.78mm/px · 7 of 15 slices shown]
[im 1/15]
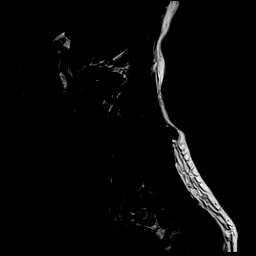
[im 3/15]
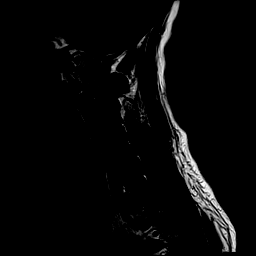
[im 5/15]
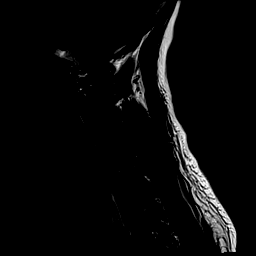
[im 8/15]
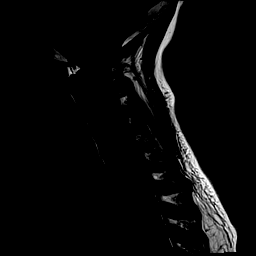
[im 10/15]
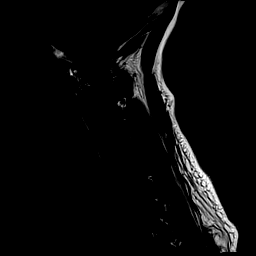
[im 12/15]
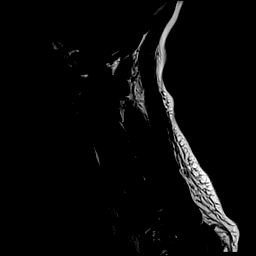
[im 15/15]
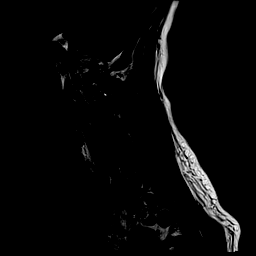

[Series 7: STIR · sagittal · 3.0mm · 0.62mm/px · 7 of 15 slices shown]
[im 1/15]
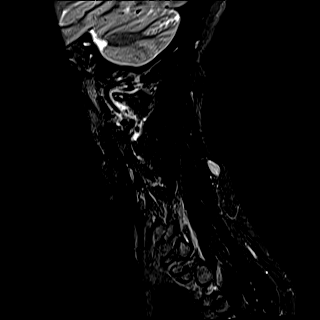
[im 3/15]
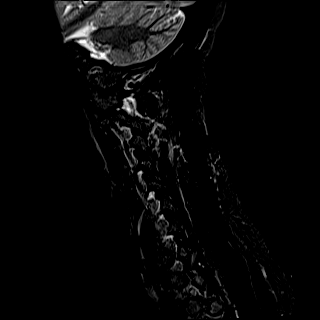
[im 5/15]
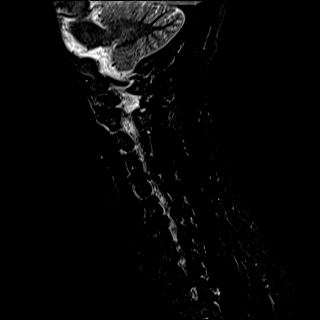
[im 8/15]
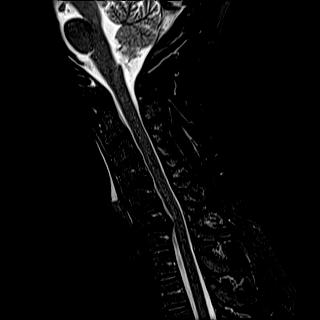
[im 10/15]
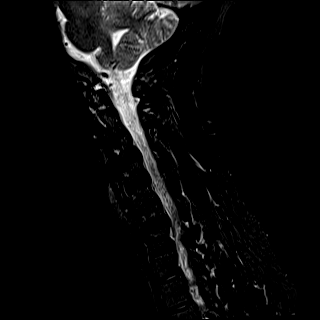
[im 12/15]
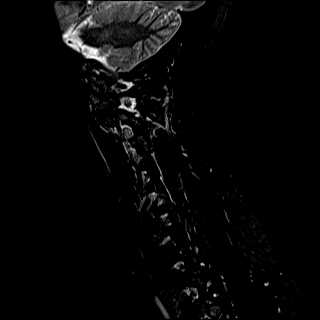
[im 15/15]
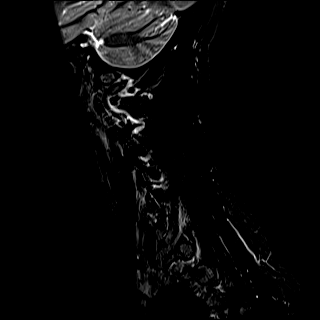

[Series 8: T2 · axial · 3.0mm · 0.70mm/px · z∈[-46,+48]mm · 8 of 29 slices shown (2 of 2)]
[im 1/29]
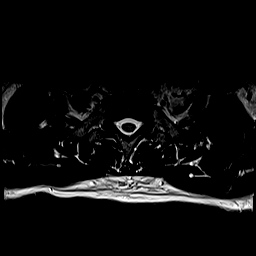
[im 5/29]
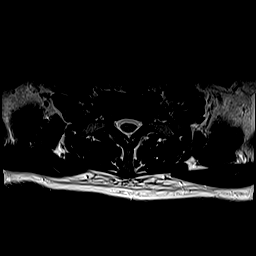
[im 9/29]
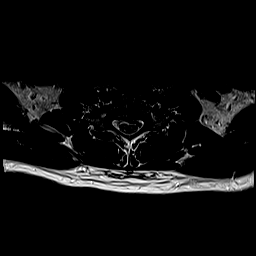
[im 13/29]
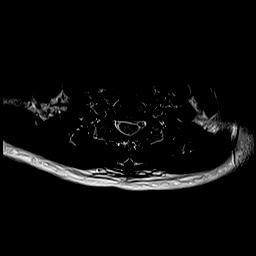
[im 16/29]
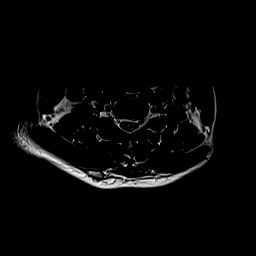
[im 20/29]
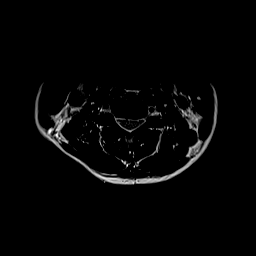
[im 24/29]
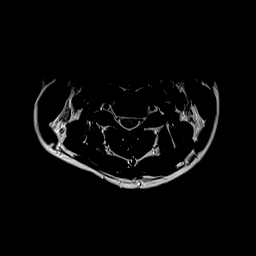
[im 29/29]
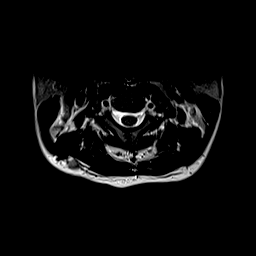

[Series 9: ax mpgr · axial · 3.0mm · 0.35mm/px · z∈[-46,+48]mm · 8 of 29 slices shown]
[im 1/29]
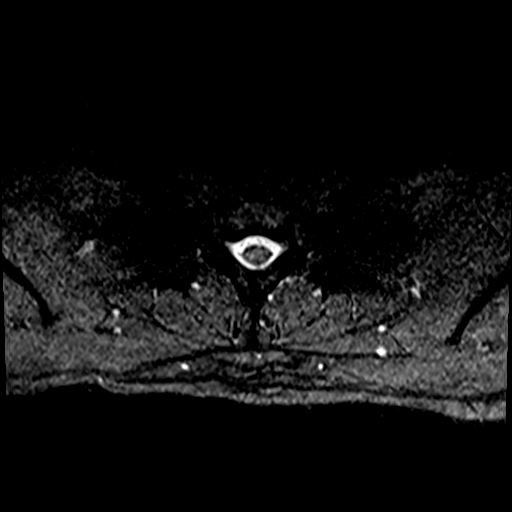
[im 5/29]
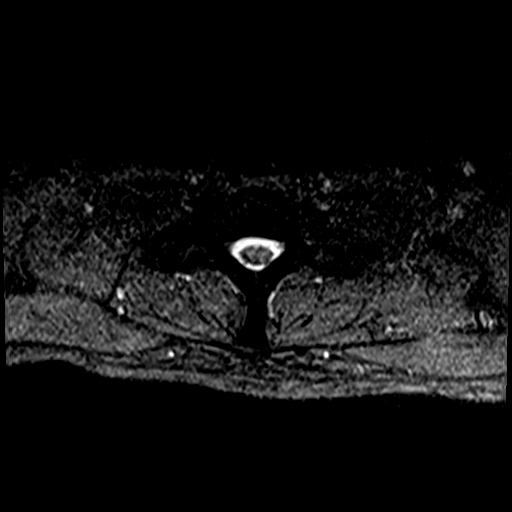
[im 9/29]
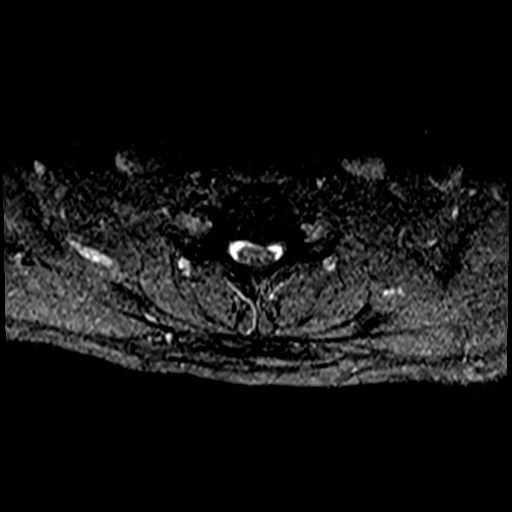
[im 13/29]
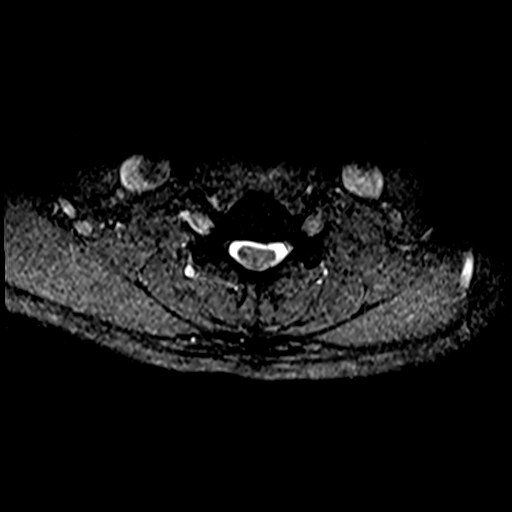
[im 16/29]
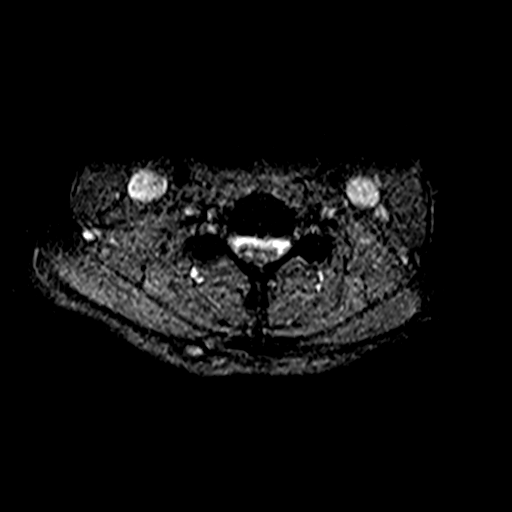
[im 20/29]
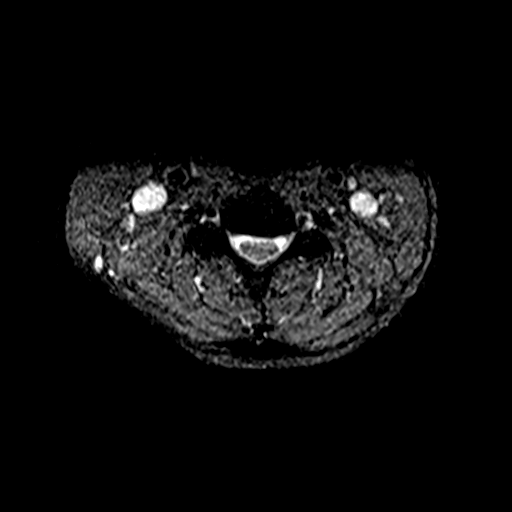
[im 24/29]
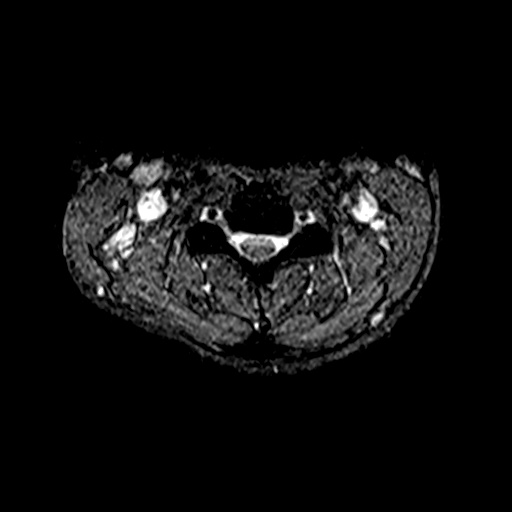
[im 29/29]
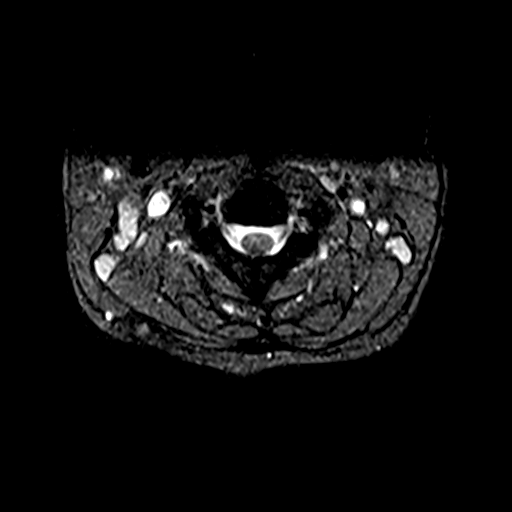

[36 of 48 positions shown; findings below may reference images not displayed]

FINDINGS: Alignment: Straightening with mild reversal of the normal cervical
lordosis. No listhesis.

Vertebrae: Vertebral body height maintained without acute or chronic
fracture. Bone marrow signal intensity diffusely decreased on T1
weighted imaging, nonspecific, but most commonly related to anemia,
smoking, or obesity. No discrete or worrisome osseous lesions. No
abnormal marrow edema.

Cord: Normal signal and morphology.

Posterior Fossa, vertebral arteries, paraspinal tissues: Visualized
brain and posterior fossa within normal limits. Craniocervical
junction normal. Paraspinous and prevertebral soft tissues
demonstrate no acute or significant finding. 9 mm cystic lesion
involving the subcutaneous fat of the right posterior neck noted,
likely a small sebaceous and/or epidermal inclusion cyst. Normal
flow voids seen within the vertebral arteries bilaterally.

Disc levels:

C2-C3: Unremarkable.

C3-C4: Minimal disc bulge with uncovertebral spurring. No spinal
stenosis. Mild bilateral C4 foraminal narrowing, slightly worse on
the left.

C4-C5: Disc bulge with bilateral uncovertebral spurring. Mild
flattening of the ventral thecal sac without significant spinal
stenosis. Moderate right with mild left C5 foraminal stenosis.

C5-C6: Small left paracentral disc protrusion indents the left
ventral thecal sac (series 8, image 15). Mild flattening of the left
hemi cord without cord signal changes. Mild left-sided spinal
stenosis. Foramina remain patent.

C6-C7: Moderate-sized left paracentral disc protrusion indents the
left ventral thecal sac, contacting and flattening the left hemi
cord (series 8, image 20). No cord signal changes. Moderate
left-sided spinal stenosis. Superimposed bilateral uncovertebral
spurring with resultant moderate right worse than left C7 foraminal
stenosis.

C7-T1: Mild disc bulge with uncovertebral spurring. Mild bilateral
facet hypertrophy. No spinal stenosis. Mild to moderate left with
mild right C8 foraminal stenosis.

Visualized upper thoracic spine demonstrates no significant finding.
IMPRESSION: 1. Disc bulging with uncovertebral spurring at C4-5 with resultant
moderate right C5 foraminal stenosis.
2. Small left paracentral disc protrusion at C5-6 with mild
flattening of the left hemi cord.
3. Moderate-sized left paracentral disc protrusion at C6-7 with
secondary flattening of the left hemi cord and resultant moderate
left-sided spinal stenosis. Superimposed uncovertebral spurring with
resultant moderate right worse than left C7 foraminal stenosis.

## 2021-02-01 ENCOUNTER — Other Ambulatory Visit: Payer: Self-pay

## 2021-02-01 ENCOUNTER — Emergency Department
Admission: EM | Admit: 2021-02-01 | Discharge: 2021-02-01 | Disposition: A | Discharge status: Home / Self Care | Payer: Medicaid Other | Attending: Emergency Medicine | Admitting: Emergency Medicine

## 2021-02-01 ENCOUNTER — Emergency Department: Payer: Medicaid Other

## 2021-02-01 ENCOUNTER — Encounter: Payer: Self-pay | Admitting: Emergency Medicine

## 2021-02-01 DIAGNOSIS — R0789 Other chest pain: Secondary | ICD-10-CM | POA: Insufficient documentation

## 2021-02-01 DIAGNOSIS — M5410 Radiculopathy, site unspecified: Secondary | ICD-10-CM | POA: Diagnosis not present

## 2021-02-01 LAB — BASIC METABOLIC PANEL
Anion gap: 7 (ref 5–15)
BUN: 12 mg/dL (ref 6–20)
CO2: 28 mmol/L (ref 22–32)
Calcium: 9.8 mg/dL (ref 8.9–10.3)
Chloride: 101 mmol/L (ref 98–111)
Creatinine, Ser: 0.81 mg/dL (ref 0.44–1.00)
GFR, Estimated: >60 mL/min (ref >60)
Glucose, Bld: 115 mg/dL — ABNORMAL HIGH (ref 70–99)
Potassium: 3.6 mmol/L (ref 3.5–5.1)
Sodium: 136 mmol/L (ref 135–145)

## 2021-02-01 LAB — CBC
HCT: 33.8 % — ABNORMAL LOW (ref 36.0–46.0)
Hemoglobin: 10.8 g/dL — ABNORMAL LOW (ref 12.0–15.0)
MCH: 26.5 pg (ref 26.0–34.0)
MCHC: 32 g/dL (ref 30.0–36.0)
MCV: 82.8 fL (ref 80.0–100.0)
Platelets: 216 10*3/uL (ref 150–400)
RBC: 4.08 MIL/uL (ref 3.87–5.11)
RDW: 14.2 % (ref 11.5–15.5)
WBC: 3.8 10*3/uL — ABNORMAL LOW (ref 4.0–10.5)
nRBC: 0 % (ref 0.0–0.2)

## 2021-02-01 LAB — POC URINE PREG, ED: Preg Test, Ur: NEGATIVE

## 2021-02-01 LAB — TROPONIN I (HIGH SENSITIVITY): Troponin I (High Sensitivity): <2 ng/L (ref <18)

## 2021-02-01 IMAGING — MR MR CERVICAL SPINE W/O CM
5 series · 40 of 48 positions shown · non-contrast
Comparison: [DATE]

CLINICAL DATA: Cervical radiculopathy, no red flags
weakness/numbness RUE x 1 week (2 weeks after local steroid
injections)

EXAM:
MRI CERVICAL SPINE WITHOUT CONTRAST
TECHNIQUE: Multiplanar, multisequence MR imaging of the cervical spine was
performed. No intravenous contrast was administered.

[Series 5: T2 · sagittal · 3.0mm · 0.62mm/px · 6 of 15 slices shown (1 of 2)]
[im 1/15]
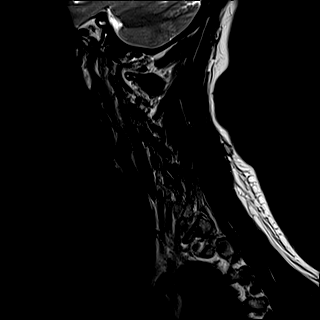
[im 3/15]
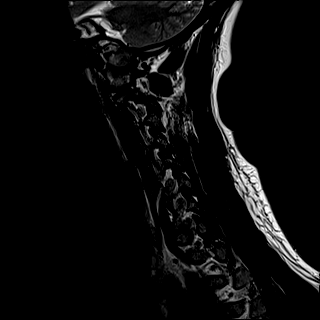
[im 6/15]
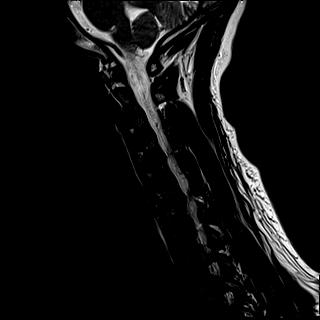
[im 9/15]
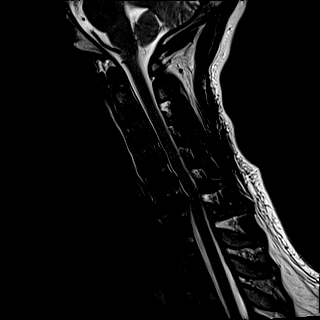
[im 12/15]
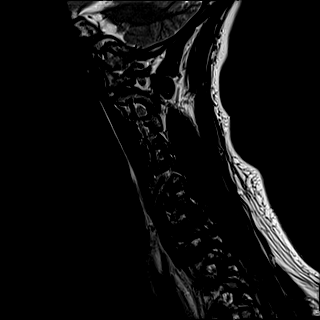
[im 15/15]
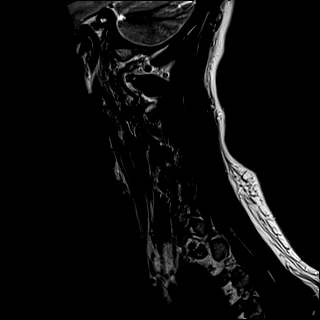

[Series 6: FLAIR · sagittal · 3.0mm · 0.78mm/px · 7 of 15 slices shown]
[im 1/15]
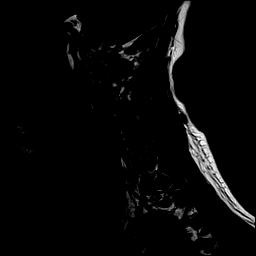
[im 3/15]
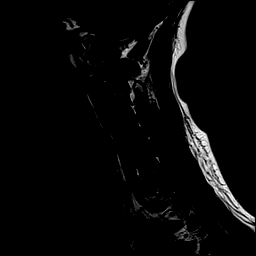
[im 5/15]
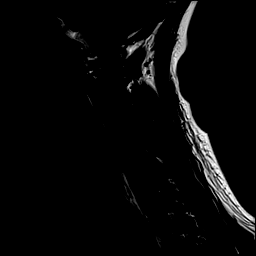
[im 8/15]
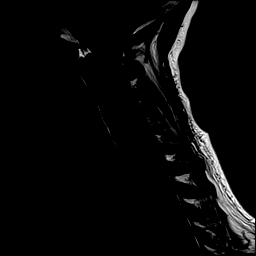
[im 10/15]
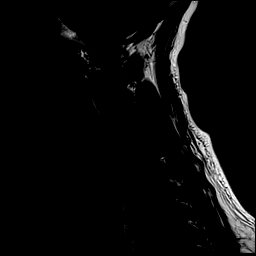
[im 12/15]
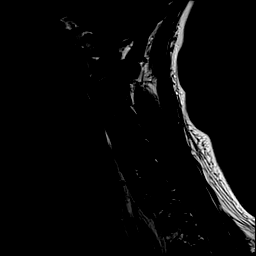
[im 15/15]
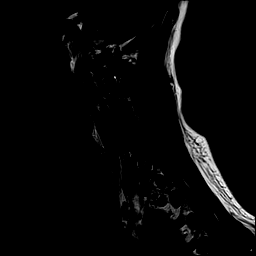

[Series 7: STIR · sagittal · 3.0mm · 0.62mm/px · 7 of 15 slices shown]
[im 1/15]
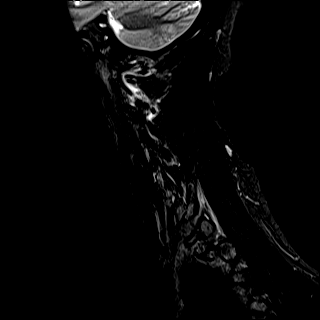
[im 3/15]
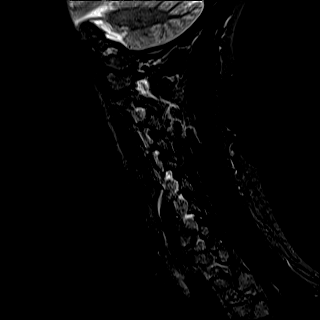
[im 5/15]
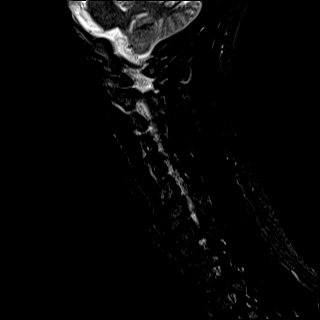
[im 8/15]
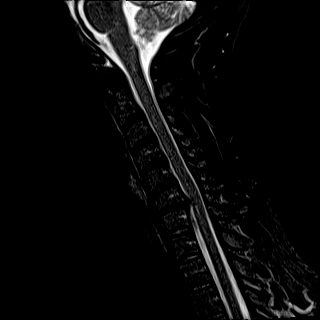
[im 10/15]
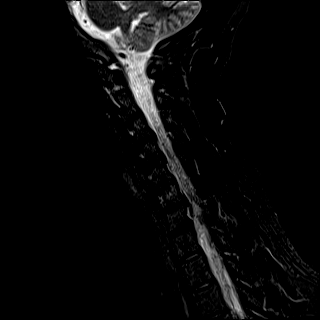
[im 12/15]
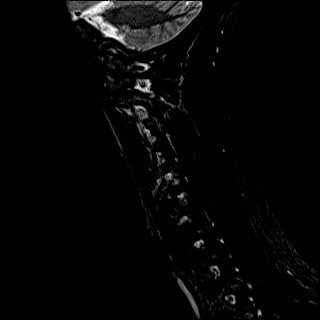
[im 15/15]
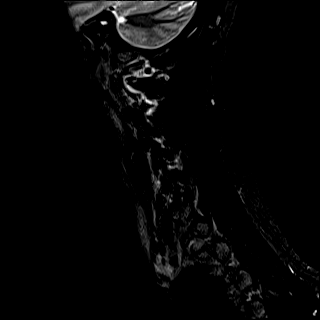

[Series 8: T2 · axial · 3.0mm · 0.70mm/px · z∈[-95,-2]mm · 12 of 29 slices shown (2 of 2)]
[im 1/29]
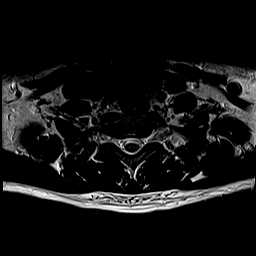
[im 3/29]
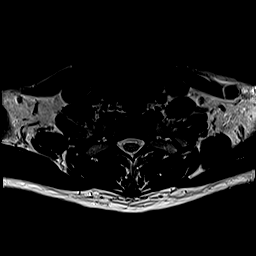
[im 5/29]
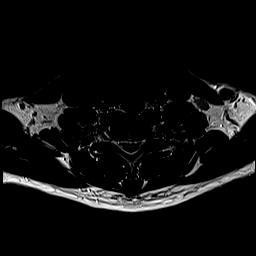
[im 7/29]
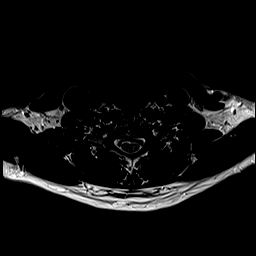
[im 9/29]
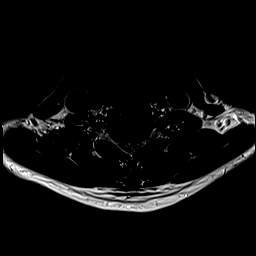
[im 11/29]
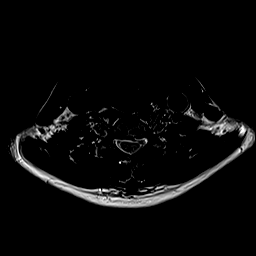
[im 13/29]
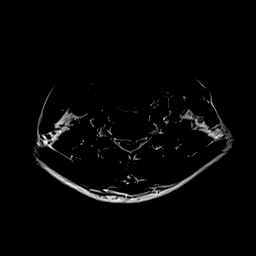
[im 16/29]
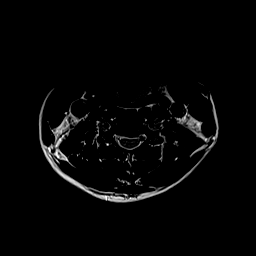
[im 18/29]
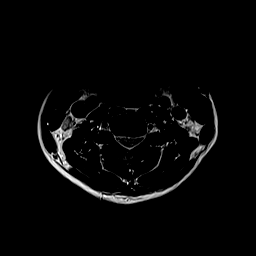
[im 20/29]
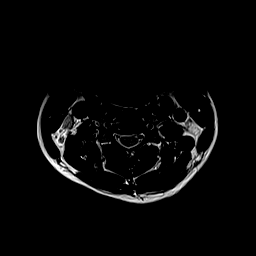
[im 24/29]
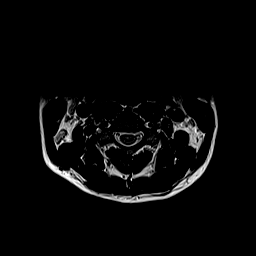
[im 29/29]
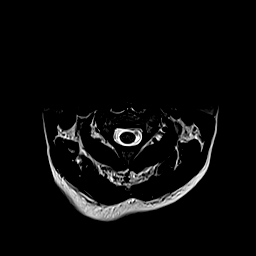

[Series 9: ax mpgr · axial · 3.0mm · 0.35mm/px · z∈[-95,-2]mm · 8 of 29 slices shown]
[im 1/29]
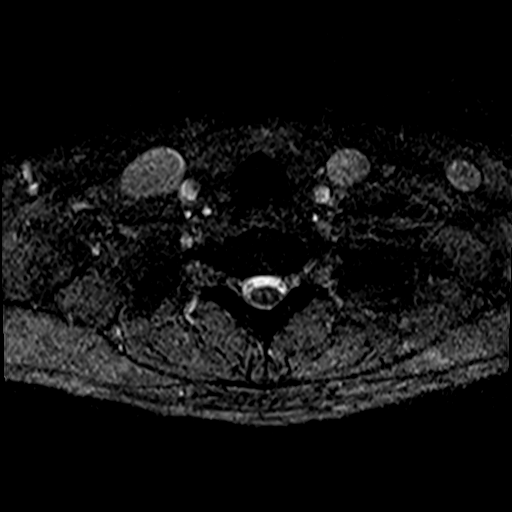
[im 5/29]
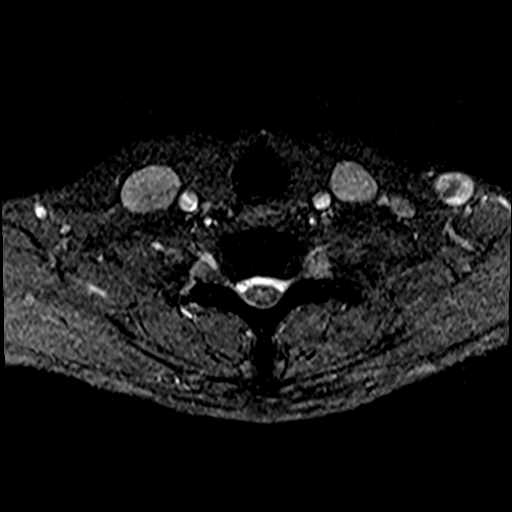
[im 9/29]
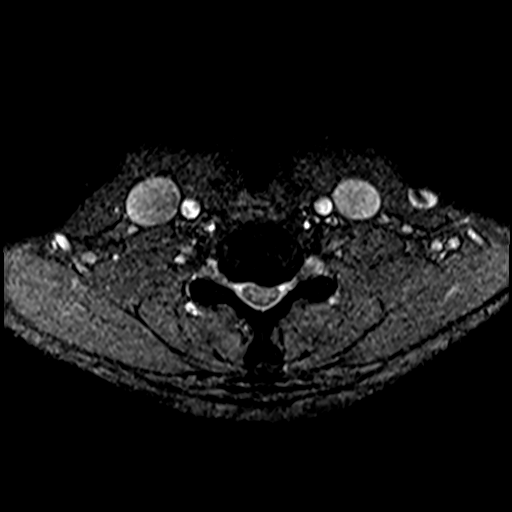
[im 13/29]
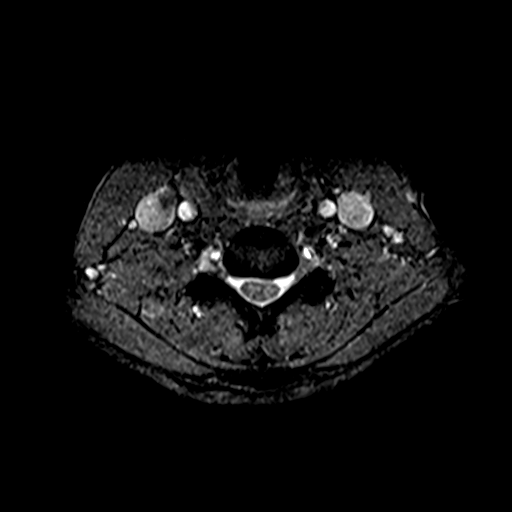
[im 16/29]
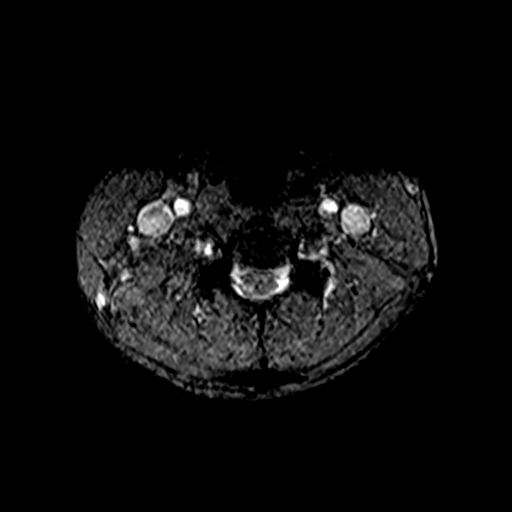
[im 20/29]
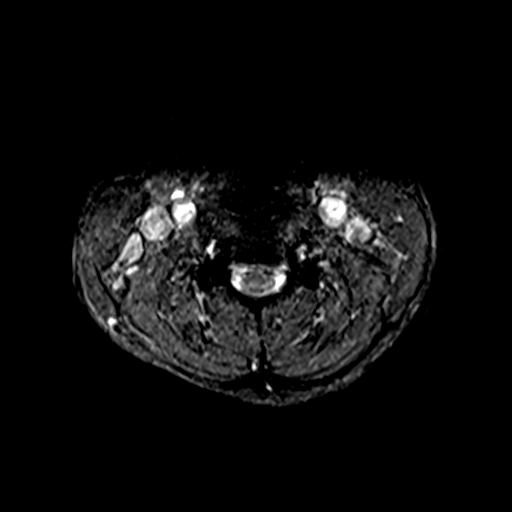
[im 24/29]
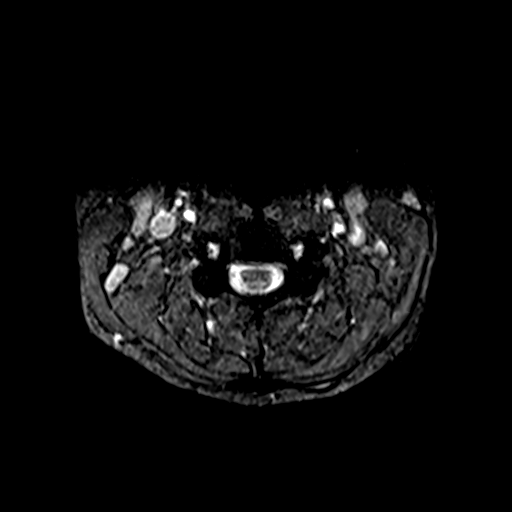
[im 29/29]
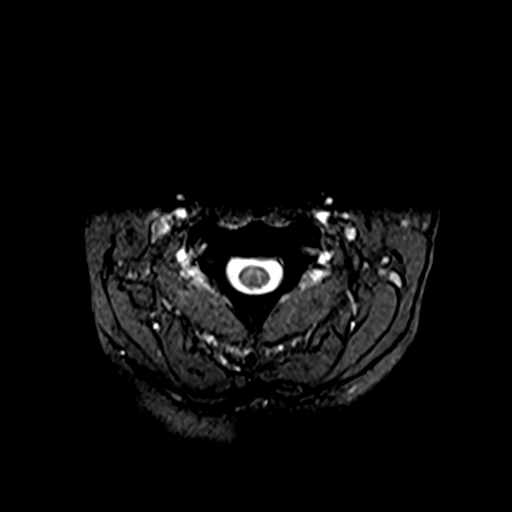

[40 of 48 positions shown; findings below may reference images not displayed]

FINDINGS: Alignment: Stable.

Vertebrae: Stable vertebral body heights. No marrow edema. No
suspicious osseous lesion.

Cord: Normal caliber and signal.

Posterior Fossa, vertebral arteries, paraspinal tissues:
Unremarkable.

Disc levels:

C2-C3:  No canal or foraminal stenosis.

C3-C4: Minimal disc bulge. Left greater than right uncovertebral
hypertrophy. No significant canal or foraminal stenosis.

C4-C5: Disc bulge with endplate osteophytes. Uncovertebral
hypertrophy. No significant canal stenosis. Moderate right and mild
left foraminal stenosis.

C5-C6: Minimal disc bulge. Possible trace superimposed left central
protrusion. No canal or foraminal stenosis.

C6-C7: Disc bulge with left central disc protrusion endplate
osteophytes. Uncovertebral hypertrophy. Mild canal stenosis. Mild
foraminal stenosis.

C7-T1: Disc bulge with endplate osteophytes. Facet hypertrophy. No
canal stenosis. Mild foraminal stenosis, left greater than right.
IMPRESSION: Multilevel degenerative changes as detailed above are stable over
the short interval. No acute abnormality.

## 2021-02-01 IMAGING — CR DG CHEST 2V
1 series · 2 of 2 positions shown · non-contrast
Comparison: Chest radiograph [DATE]

CLINICAL DATA: Sharp chest pain

EXAM:
CHEST - 2 VIEW

[Series 1: dg chest 2 view · 0.14mm/px · 2 of 2 slices shown]
[im 1/2]
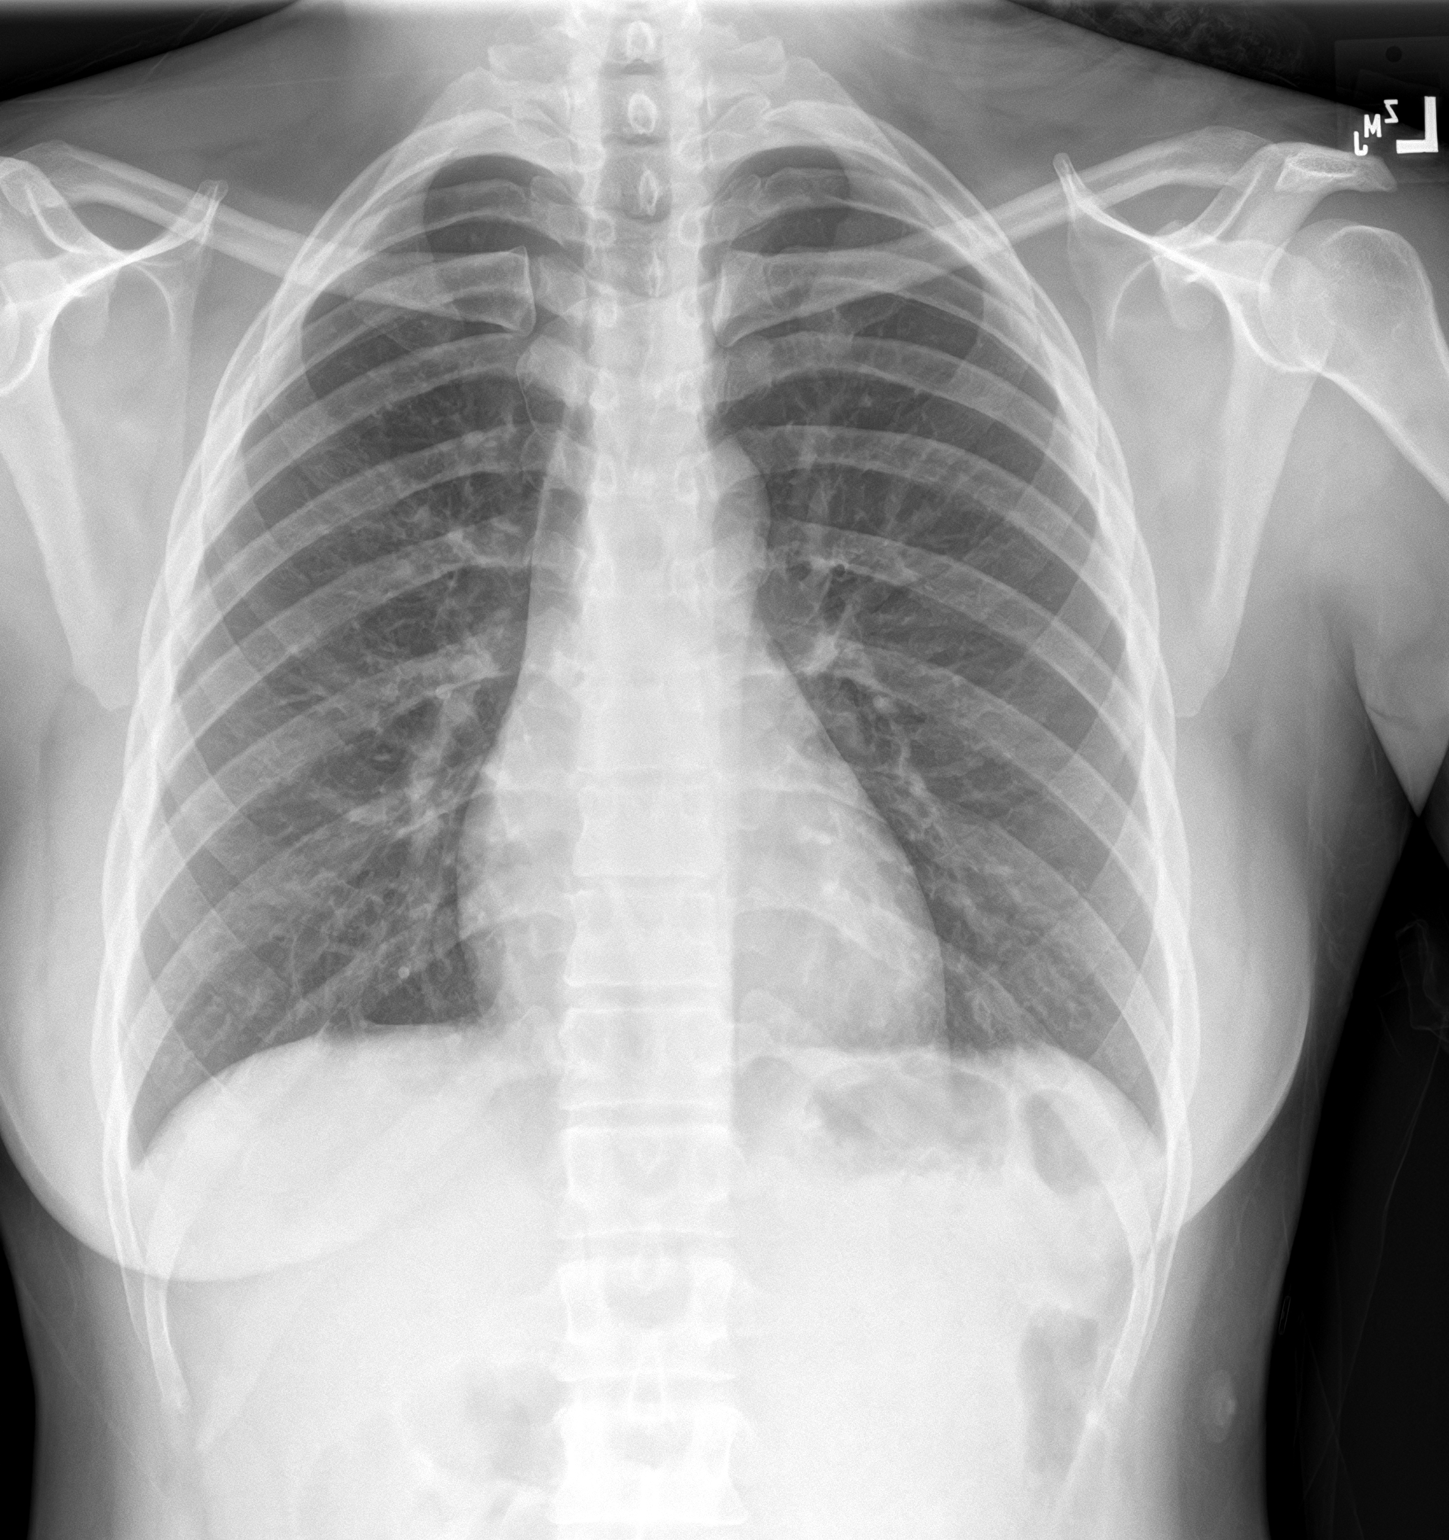
[im 2/2]
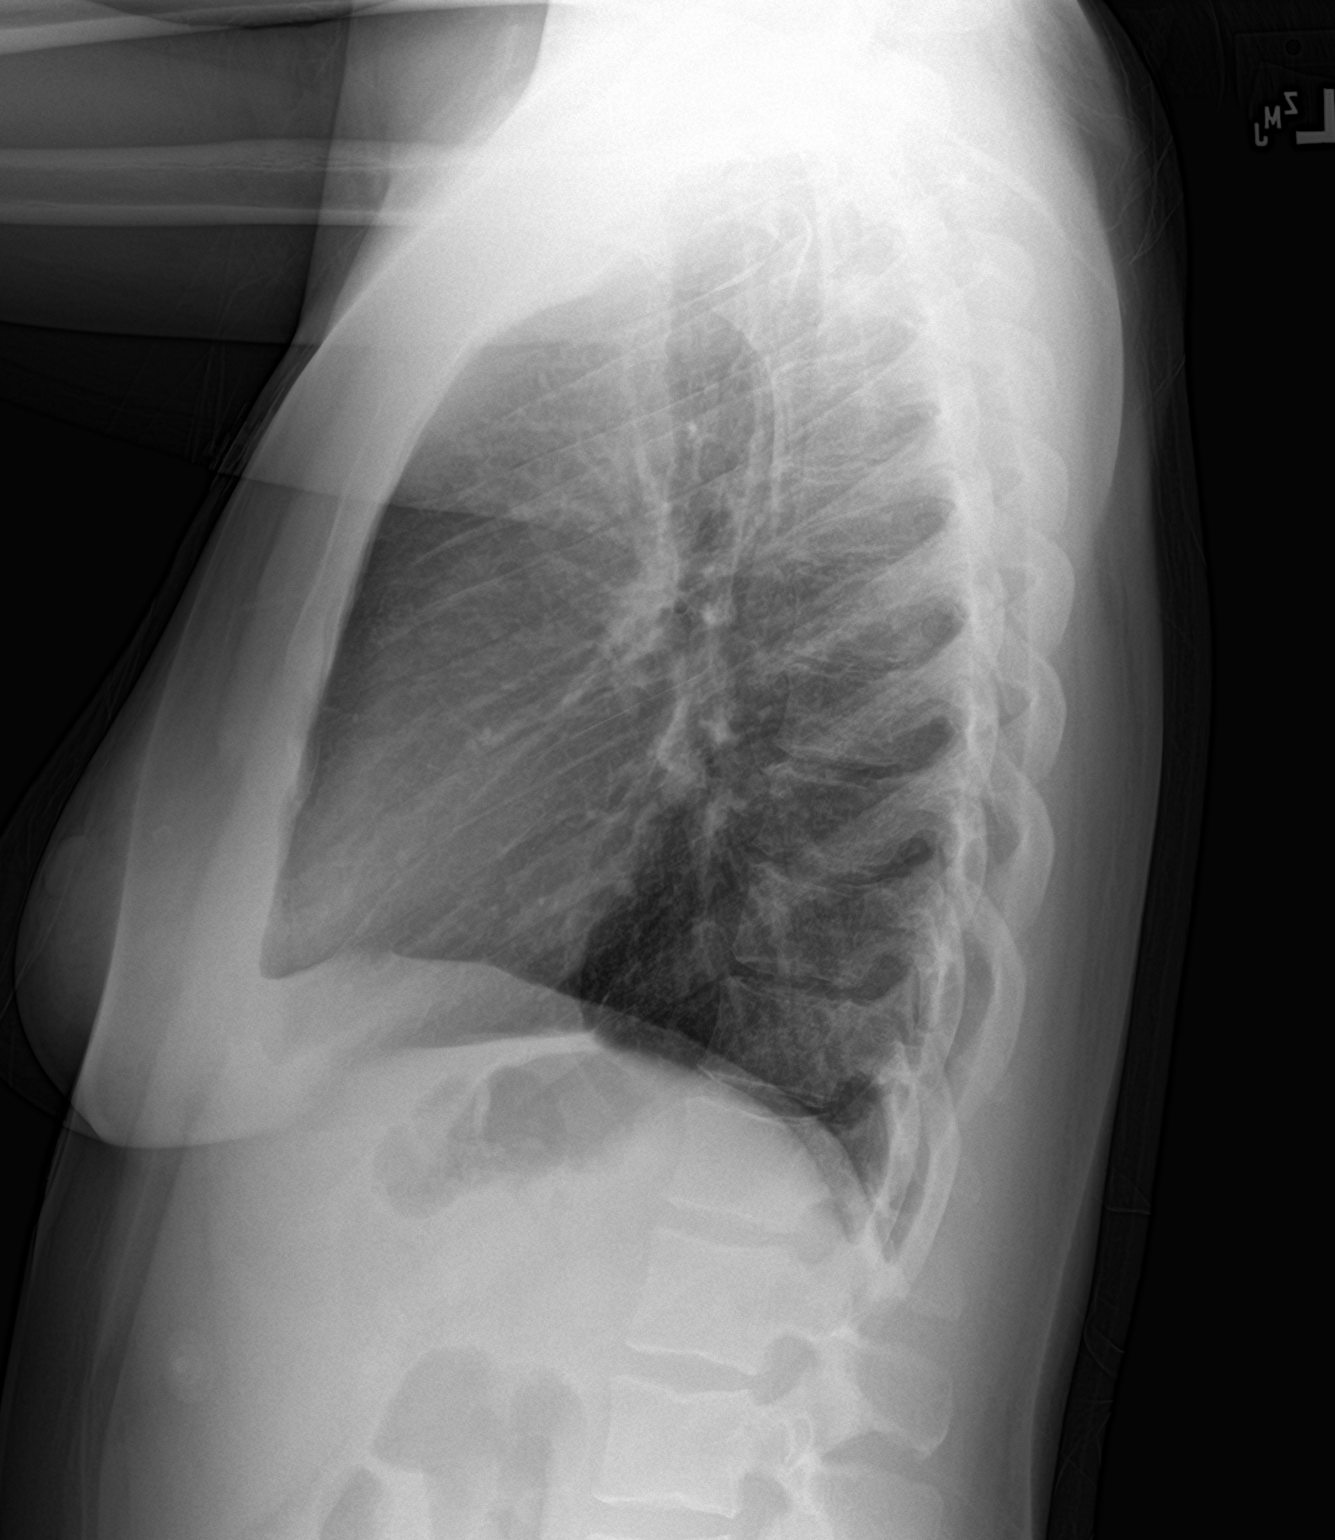

[2 of 2 positions shown; findings below may reference images not displayed]

FINDINGS: The cardiomediastinal silhouette is normal.

The lungs are clear, without focal consolidation or pulmonary edema.
There is no pleural effusion or pneumothorax.

There is no acute osseous abnormality.
IMPRESSION: No radiographic evidence of acute cardiopulmonary process.

## 2021-02-01 MED ORDER — KETOROLAC TROMETHAMINE 60 MG/2ML IM SOLN
60.0000 mg | Freq: Once | INTRAMUSCULAR | Status: AC
  Administered 2021-02-01: 60 mg via INTRAMUSCULAR
  Filled 2021-02-01: qty 2

## 2021-02-01 MED ORDER — OXYCODONE-ACETAMINOPHEN 5-325 MG PO TABS: 1.0000 | ORAL_TABLET | ORAL | 0 refills | Status: AC | PRN

## 2021-02-01 MED ORDER — PREDNISONE 10 MG PO TABS
10.0000 mg | ORAL_TABLET | Freq: Every day | ORAL | 0 refills | Status: AC
Start: 2021-02-01 — End: ?
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
## 2021-02-01 NOTE — ED Notes (Signed)
Spoke with patient mother, informed brenda that patient is in MRI and will be back to ED afterwards

## 2021-06-26 ENCOUNTER — Other Ambulatory Visit: Payer: Medicaid Other

## 2021-06-30 ENCOUNTER — Other Ambulatory Visit: Payer: Medicaid Other

## 2021-07-08 ENCOUNTER — Other Ambulatory Visit: Payer: Self-pay | Admitting: Family Medicine

## 2021-07-08 DIAGNOSIS — Z8619 Personal history of other infectious and parasitic diseases: Secondary | ICD-10-CM

## 2021-08-10 NOTE — Telephone Encounter (Signed)
Pt has not been seen since 2021.  Needs appointment with provider in clinic for medication refill.  Please contact patient and make aware of need for clinic visit.   ?

## 2021-08-12 ENCOUNTER — Ambulatory Visit (INDEPENDENT_AMBULATORY_CARE_PROVIDER_SITE_OTHER): Payer: Medicaid Other | Admitting: Dermatology

## 2021-08-12 DIAGNOSIS — L2489 Irritant contact dermatitis due to other agents: Secondary | ICD-10-CM

## 2021-08-12 DIAGNOSIS — L2081 Atopic neurodermatitis: Secondary | ICD-10-CM

## 2021-08-12 MED ORDER — FLUOCINONIDE 0.05 % EX CREA: TOPICAL_CREAM | CUTANEOUS | 1 refills | Status: DC

## 2021-08-12 NOTE — Telephone Encounter (Signed)
Telephone call to patient this morning regarding her Valtrex refill request.  Per provider, Junious Dresser, NP: she will need a PE (last one was 2021).  We cannot refill anymore Valtrex until PE scheduled.  PE appointment is 08-20-21 at 11 am (10:30 arrival time.  Dahlia Bailiff, RN ? ?

## 2021-08-12 NOTE — Progress Notes (Signed)
? ?  New Patient Visit ? ?Subjective  ?Heidi Mata is a 36 y.o. female who presents for the following: Dermatitis (Pt with hx of eczema ). ?Pt using otc Gold bond with a poor response. 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 ? ?The following portions of the chart were reviewed this encounter and updated as appropriate:  ? Tobacco  Allergies  Meds  Problems  Med Hx  Surg Hx  Fam Hx   ?  ?Review of Systems:  No other skin or systemic complaints except as noted in HPI or Assessment and Plan. ? ?Objective  ?Well appearing patient in no apparent distress; mood and affect are within normal limits. ? ?A focused examination was performed including arms,hands,legs. Relevant physical exam findings are noted in the Assessment and Plan. ? ?Hands; legs ?Hyperpigmented patches with scale on the hands, hyperlinear palms, hyperpigmented patch on the legs  ? ?hands ? ? ? ? ? ? ? ? ? ? ? 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 ?Atopic dermatitis (eczema) is a chronic, relapsing, pruritic condition that can significantly affect quality of life. It is often associated with allergic rhinitis and/or asthma and can require treatment with topical medications, phototherapy, or in severe cases biologic injectable medication (Dupixent; Adbry) or Oral JAK inhibitors.  ? ?Wear gloves when working with chemicals at cosmetology school. ? ?Start Fluocinonide cream apply to hands and legs qd-bid up to 5 days a week  ?We will plan on changing to a non steroid topical at the next office visit ? ?Related Medications ?fluocinonide cream (LIDEX) 0.05 % ?Apply to affected skin qd-bid up 5 days week, then stop ? ?Irritant vs Allergic contact dermatitis due to other agents ?hands ?Contact dermatitis to Acrylic chemicals pt uses in cosmetology school.  ?Wear gloves when at school working with chemicals  ? ?Related  Medications ?fluocinonide cream (LIDEX) 0.05 % ?Apply to affected skin qd-bid up 5 days week, then stop ? ?Return in about 6 weeks (around 09/23/2021) for atopic dermatitis, contact dermatitis . ? ?I, Marye Round, CMA, am acting as scribe for Sarina Ser, MD .  ?Documentation: I have reviewed the above documentation for accuracy and completeness, and I agree with the above. ? ?Sarina Ser, MD ? ?

## 2021-08-12 NOTE — Patient Instructions (Signed)

## 2021-08-18 ENCOUNTER — Telehealth: Payer: Self-pay

## 2021-08-18 NOTE — Telephone Encounter (Signed)
Medicaid covers Betamethasone Valerate Cream. Are you okay with this strength? ?

## 2021-08-18 NOTE — Telephone Encounter (Signed)
Fluocinonide Cream denied by Medicaid. Formulary is patient must try and fail betamethasone valerate cream or ointment OR Triamcinolone Cream OR Ointment.  ?

## 2021-08-19 ENCOUNTER — Encounter: Payer: Self-pay | Admitting: Dermatology

## 2021-08-20 ENCOUNTER — Ambulatory Visit: Payer: Medicaid Other

## 2021-08-24 MED ORDER — TRIAMCINOLONE ACETONIDE 0.1 % EX CREA: 1.0000 "application " | TOPICAL_CREAM | CUTANEOUS | 1 refills | Status: DC

## 2021-08-24 NOTE — Telephone Encounter (Signed)
Unable to contact patient by phone. RX sent in.  ?Will send MyChart message.  ?

## 2021-09-30 ENCOUNTER — Ambulatory Visit (INDEPENDENT_AMBULATORY_CARE_PROVIDER_SITE_OTHER): Payer: Medicaid Other | Admitting: Dermatology

## 2021-09-30 DIAGNOSIS — L209 Atopic dermatitis, unspecified: Secondary | ICD-10-CM

## 2021-09-30 MED ORDER — MOMETASONE FUROATE 0.1 % EX OINT
TOPICAL_OINTMENT | CUTANEOUS | 3 refills | Status: AC
Start: 2021-09-30 — End: ?

## 2021-09-30 NOTE — Progress Notes (Signed)
   Follow-Up Visit   Subjective  Heidi Mata is a 36 y.o. female who presents for the following: Eczema (On the hands, legs, and neck - pt was unable to pick up the prescriptions due to financial situations. She is currently using Aveeno moisturizer daily. Condition has been ok since the weather has starting warming up). Also with similar issue eyelids - currently clear.  The following portions of the chart were reviewed this encounter and updated as appropriate:   Tobacco  Allergies  Meds  Problems  Med Hx  Surg Hx  Fam Hx     Review of Systems:  No other skin or systemic complaints except as noted in HPI or Assessment and Plan.  Objective  Well appearing patient in no apparent distress; mood and affect are within normal limits.  A focused examination was performed including the face, trunk, extremities. Relevant physical exam findings are noted in the Assessment and Plan.  B/L hands and legs Lichenification with hyperpigmentation on the hands and lower legs, eyelids mostly clear.   Assessment & Plan  Atopic dermatitis, Chronic and persistent condition with duration or expected duration over one year. Condition is symptomatic / bothersome to patient. Not to goal.  B/L hands and legs and Eyelids  Hand dermatitis/dyshidrotic eczema -   Start Mometasone 0.1% ointment to aa's QD-BID up to 5d/wk. Topical steroids (such as triamcinolone, fluocinolone, fluocinonide, mometasone, clobetasol, halobetasol, betamethasone, hydrocortisone) can cause thinning and lightening of the skin if they are used for too long in the same area. Your physician has selected the right strength medicine for your problem and area affected on the body. Please use your medication only as directed by your physician to prevent side effects.   Consider Patch Testing for Eyelid Dermatitis in future.  mometasone (ELOCON) 0.1 % ointment - B/L hands and legs Apply to aa's eczema QD-BID up to 5 days per  week.  Return in about 4 months (around 01/30/2022) for AD follow up .  Luther Redo, CMA, am acting as scribe for Sarina Ser, MD . Documentation: I have reviewed the above documentation for accuracy and completeness, and I agree with the above.  Sarina Ser, MD

## 2021-09-30 NOTE — Patient Instructions (Addendum)
Topical steroids (such as triamcinolone, fluocinolone, fluocinonide, mometasone, clobetasol, halobetasol, betamethasone, hydrocortisone) can cause thinning and lightening of the skin if they are used for too long in the same area. Your physician has selected the right strength medicine for your problem and area affected on the body. Please use your medication only as directed by your physician to prevent side effects.    Due to recent changes in healthcare laws, you may see results of your pathology and/or laboratory studies on MyChart before the doctors have had a chance to review them. We understand that in some cases there may be results that are confusing or concerning to you. Please understand that not all results are received at the same time and often the doctors may need to interpret multiple results in order to provide you with the best plan of care or course of treatment. Therefore, we ask that you please give us 2 business days to thoroughly review all your results before contacting the office for clarification. Should we see a critical lab result, you will be contacted sooner.   If You Need Anything After Your Visit  If you have any questions or concerns for your doctor, please call our main line at [phone] and press option 4 to reach your doctor's medical assistant. If no one answers, please leave a voicemail as directed and we will return your call as soon as possible. Messages left after 4 pm will be answered the following business day.   You may also send us a message via MyChart. We typically respond to MyChart messages within 1-2 business days.  For prescription refills, please ask your pharmacy to contact our office. Our fax number is [phone].  If you have an urgent issue when the clinic is closed that cannot wait until the next business day, you can page your doctor at the number below.    Please note that while we do our best to be available for urgent issues outside of  office hours, we are not available 24/7.   If you have an urgent issue and are unable to reach us, you may choose to seek medical care at your doctor's office, retail clinic, urgent care center, or emergency room.  If you have a medical emergency, please immediately call 911 or go to the emergency department.  Pager Numbers  - Dr. Kowalski: [phone]  - Dr. Moye: [phone]  - Dr. Stewart: [phone]  In the event of inclement weather, please call our main line at [phone] for an update on the status of any delays or closures.  Dermatology Medication Tips: Please keep the boxes that topical medications come in in order to help keep track of the instructions about where and how to use these. Pharmacies typically print the medication instructions only on the boxes and not directly on the medication tubes.   If your medication is too expensive, please contact our office at [phone] option 4 or send us a message through MyChart.   We are unable to tell what your co-pay for medications will be in advance as this is different depending on your insurance coverage. However, we may be able to find a substitute medication at lower cost or fill out paperwork to get insurance to cover a needed medication.   If a prior authorization is required to get your medication covered by your insurance company, please allow us 1-2 business days to complete this process.  Drug prices often vary depending on where the prescription is filled and some pharmacies   may offer cheaper prices.  The website www.goodrx.com contains coupons for medications through different pharmacies. The prices here do not account for what the cost may be with help from insurance (it may be cheaper with your insurance), but the website can give you the price if you did not use any insurance.  - You can print the associated coupon and take it with your prescription to the pharmacy.  - You may also stop by our office during  regular business hours and pick up a GoodRx coupon card.  - If you need your prescription sent electronically to a different pharmacy, notify our office through [place] MyChart or by phone at [phone] option 4.     Si Usted Necesita Algo Despus de Su Visita  Tambin puede enviarnos un mensaje a travs de MyChart. Por lo general respondemos a los mensajes de MyChart en el transcurso de 1 a 2 das hbiles.  Para renovar recetas, por favor pida a su farmacia que se ponga en contacto con nuestra oficina. Nuestro nmero de fax es el [phone].  Si tiene un asunto urgente cuando la clnica est cerrada y que no puede esperar hasta el siguiente da hbil, puede llamar/localizar a su doctor(a) al nmero que aparece a continuacin.   Por favor, tenga en cuenta que aunque hacemos todo lo posible para estar disponibles para asuntos urgentes fuera del horario de oficina, no estamos disponibles las 24 horas del da, los 7 das de la semana.   Si tiene un problema urgente y no puede comunicarse con nosotros, puede optar por buscar atencin mdica  en el consultorio de su doctor(a), en una clnica privada, en un centro de atencin urgente o en una sala de emergencias.  Si tiene una emergencia mdica, por favor llame inmediatamente al 911 o vaya a la sala de emergencias.  Nmeros de bper  - Dr. Kowalski: [phone]  - Dra. Moye: [phone]  - Dra. Stewart: [phone]  En caso de inclemencias del tiempo, por favor llame a nuestra lnea principal al [phone] para una actualizacin sobre el estado de cualquier retraso o cierre.  Consejos para la medicacin en dermatologa: Por favor, guarde las cajas en las que vienen los medicamentos de uso tpico para ayudarle a seguir las instrucciones sobre dnde y cmo usarlos. Las farmacias generalmente imprimen las instrucciones del medicamento slo en las cajas y no directamente en los tubos del medicamento.   Si su medicamento es muy  caro, por favor, pngase en contacto con nuestra oficina llamando al [phone] y presione la opcin 4 o envenos un mensaje a travs de MyChart.   No podemos decirle cul ser su copago por los medicamentos por adelantado ya que esto es diferente dependiendo de la cobertura de su seguro. Sin embargo, es posible que podamos encontrar un medicamento sustituto a menor costo o llenar un formulario para que el seguro cubra el medicamento que se considera necesario.   Si se requiere una autorizacin previa para que su compaa de seguros cubra su medicamento, por favor permtanos de 1 a 2 das hbiles para completar este proceso.  Los precios de los medicamentos varan con frecuencia dependiendo del lugar de dnde se surte la receta y alguna farmacias pueden ofrecer precios ms baratos.  El sitio web www.goodrx.com tiene cupones para medicamentos de diferentes farmacias. Los precios aqu no tienen en cuenta lo que podra costar con la ayuda del seguro (puede ser ms barato con su seguro), pero el sitio web puede darle el precio si no   utiliz ningn seguro.  - Puede imprimir el cupn correspondiente y llevarlo con su receta a la farmacia.  - Tambin puede pasar por nuestra oficina durante el horario de atencin regular y recoger una tarjeta de cupones de GoodRx.  - Si necesita que su receta se enve electrnicamente a una farmacia diferente, informe a nuestra oficina a travs de MyChart de [facility] o por telfono llamando al [phone] y presione la opcin 4.  

## 2021-10-01 ENCOUNTER — Encounter: Payer: Self-pay | Admitting: Dermatology

## 2021-10-01 ENCOUNTER — Telehealth: Payer: Self-pay

## 2021-10-01 ENCOUNTER — Other Ambulatory Visit: Payer: Self-pay

## 2021-10-01 MED ORDER — MOMETASONE FUROATE 0.1 % EX CREA: 1.0000 | TOPICAL_CREAM | Freq: Every day | CUTANEOUS | 2 refills | Status: AC | PRN

## 2021-10-01 NOTE — Telephone Encounter (Signed)
VM left for patient to return call about medication change. Rx sent in

## 2021-10-01 NOTE — Telephone Encounter (Signed)
Fax from [facility] pharmacy states that the ordered Mometasone 0.1% Ointment is on backorder. Would switching to cream be okay?

## 2021-11-04 ENCOUNTER — Ambulatory Visit: Payer: Medicaid Other | Attending: Primary Care | Admitting: Physical Therapy

## 2021-11-04 VITALS — BP 112/78 | HR 86

## 2021-11-04 DIAGNOSIS — M542 Cervicalgia: Secondary | ICD-10-CM | POA: Diagnosis present

## 2021-11-04 DIAGNOSIS — M6281 Muscle weakness (generalized): Secondary | ICD-10-CM | POA: Insufficient documentation
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
## 2021-11-05 ENCOUNTER — Encounter: Payer: Self-pay | Admitting: Physical Therapy

## 2021-11-09 ENCOUNTER — Ambulatory Visit: Payer: Medicaid Other | Admitting: Physical Therapy

## 2021-11-11 ENCOUNTER — Ambulatory Visit: Payer: Medicaid Other | Admitting: Physical Therapy

## 2021-11-11 DIAGNOSIS — M6281 Muscle weakness (generalized): Secondary | ICD-10-CM

## 2021-11-11 DIAGNOSIS — M542 Cervicalgia: Secondary | ICD-10-CM
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
## 2021-11-12 ENCOUNTER — Encounter: Payer: Self-pay | Admitting: Physical Therapy

## 2021-11-16 ENCOUNTER — Ambulatory Visit: Payer: Medicaid Other | Admitting: Physical Therapy

## 2021-11-16 ENCOUNTER — Encounter: Payer: Self-pay | Admitting: Physical Therapy

## 2021-11-16 DIAGNOSIS — M6281 Muscle weakness (generalized): Secondary | ICD-10-CM

## 2021-11-16 DIAGNOSIS — M542 Cervicalgia: Secondary | ICD-10-CM | POA: Diagnosis not present
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
## 2021-11-18 ENCOUNTER — Ambulatory Visit: Payer: Medicaid Other | Admitting: Physical Therapy

## 2021-11-23 ENCOUNTER — Ambulatory Visit: Payer: Medicaid Other | Admitting: Physical Therapy

## 2021-11-25 ENCOUNTER — Ambulatory Visit: Payer: Medicaid Other | Attending: Primary Care | Admitting: Physical Therapy

## 2021-11-25 DIAGNOSIS — M6281 Muscle weakness (generalized): Secondary | ICD-10-CM | POA: Insufficient documentation

## 2021-11-25 DIAGNOSIS — M542 Cervicalgia: Secondary | ICD-10-CM | POA: Insufficient documentation

## 2021-11-30 ENCOUNTER — Ambulatory Visit: Payer: Medicaid Other | Admitting: Physical Therapy

## 2022-02-01 ENCOUNTER — Ambulatory Visit: Payer: Medicaid Other | Admitting: Dermatology

## 2022-05-13 ENCOUNTER — Ambulatory Visit: Payer: Medicaid Other

## 2023-05-26 ENCOUNTER — Other Ambulatory Visit: Payer: Self-pay | Admitting: Primary Care

## 2023-05-26 DIAGNOSIS — N63 Unspecified lump in unspecified breast: Secondary | ICD-10-CM

## 2023-05-26 DIAGNOSIS — N644 Mastodynia: Secondary | ICD-10-CM

## 2023-06-13 ENCOUNTER — Ambulatory Visit
Admission: RE | Admit: 2023-06-13 | Discharge: 2023-06-13 | Disposition: A | Discharge status: Home / Self Care | Payer: Medicaid Other | Source: Ambulatory Visit | Attending: Primary Care

## 2023-06-13 ENCOUNTER — Ambulatory Visit
Admission: RE | Admit: 2023-06-13 | Discharge: 2023-06-13 | Disposition: A | Discharge status: Home / Self Care | Payer: Medicaid Other | Source: Ambulatory Visit | Attending: Primary Care | Admitting: Primary Care

## 2023-06-13 DIAGNOSIS — N644 Mastodynia: Secondary | ICD-10-CM | POA: Diagnosis present

## 2023-06-13 DIAGNOSIS — N63 Unspecified lump in unspecified breast: Secondary | ICD-10-CM

## 2023-06-16 ENCOUNTER — Other Ambulatory Visit: Payer: Self-pay | Admitting: Primary Care

## 2023-06-16 DIAGNOSIS — N6489 Other specified disorders of breast: Secondary | ICD-10-CM

## 2023-06-16 DIAGNOSIS — R921 Mammographic calcification found on diagnostic imaging of breast: Secondary | ICD-10-CM

## 2023-06-16 DIAGNOSIS — R928 Other abnormal and inconclusive findings on diagnostic imaging of breast: Secondary | ICD-10-CM

## 2023-06-23 ENCOUNTER — Ambulatory Visit
Admission: RE | Admit: 2023-06-23 | Discharge: 2023-06-23 | Disposition: A | Discharge status: Home / Self Care | Payer: Medicaid Other | Source: Ambulatory Visit | Attending: Primary Care | Admitting: Primary Care

## 2023-06-23 DIAGNOSIS — N6489 Other specified disorders of breast: Secondary | ICD-10-CM | POA: Insufficient documentation

## 2023-06-23 DIAGNOSIS — R921 Mammographic calcification found on diagnostic imaging of breast: Secondary | ICD-10-CM | POA: Insufficient documentation

## 2023-06-23 DIAGNOSIS — R928 Other abnormal and inconclusive findings on diagnostic imaging of breast: Secondary | ICD-10-CM

## 2023-06-23 DIAGNOSIS — N6022 Fibroadenosis of left breast: Secondary | ICD-10-CM | POA: Diagnosis present

## 2023-06-23 HISTORY — PX: BREAST BIOPSY: SHX20

## 2023-06-23 MED ORDER — LIDOCAINE-EPINEPHRINE 1 %-1:100000 IJ SOLN
10.0000 mL | Freq: Once | INTRAMUSCULAR | Status: AC
  Administered 2023-06-23: 10 mL

## 2023-06-23 MED ORDER — LIDOCAINE 1 % OPTIME INJ - NO CHARGE
5.0000 mL | Freq: Once | INTRAMUSCULAR | Status: AC
  Administered 2023-06-23: 5 mL
  Filled 2023-06-23: qty 6

## 2023-06-23 MED ORDER — LIDOCAINE-EPINEPHRINE 1 %-1:100000 IJ SOLN
20.0000 mL | Freq: Once | INTRAMUSCULAR | Status: AC
  Administered 2023-06-23: 20 mL
  Filled 2023-06-23: qty 20

## 2023-06-23 MED ORDER — LIDOCAINE HCL (PF) 1 % IJ SOLN
5.0000 mL | Freq: Once | INTRAMUSCULAR | Status: AC
Start: 2023-06-23 — End: 2023-06-23
  Administered 2023-06-23: 5 mL

## 2023-06-24 LAB — SURGICAL PATHOLOGY
